# Patient Record
Sex: Female | Born: 2000 | Hispanic: No | Smoking: Never smoker
Health system: Southern US, Community
[De-identification: ages and names within clinical notes are randomized; demographics above are authoritative.]

## PROBLEM LIST (undated history)

## (undated) DIAGNOSIS — A159 Respiratory tuberculosis unspecified: Secondary | ICD-10-CM

## (undated) DIAGNOSIS — D649 Anemia, unspecified: Secondary | ICD-10-CM

## (undated) DIAGNOSIS — Z789 Other specified health status: Secondary | ICD-10-CM

## (undated) DIAGNOSIS — N390 Urinary tract infection, site not specified: Secondary | ICD-10-CM

## (undated) HISTORY — DX: Anemia, unspecified: D64.9

## (undated) HISTORY — PX: NO PAST SURGERIES: SHX2092

## (undated) HISTORY — DX: Urinary tract infection, site not specified: N39.0

## (undated) HISTORY — DX: Respiratory tuberculosis unspecified: A15.9

---
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
## 2018-02-14 ENCOUNTER — Other Ambulatory Visit: Payer: Self-pay | Admitting: Internal Medicine

## 2018-02-14 ENCOUNTER — Ambulatory Visit
Admission: RE | Admit: 2018-02-14 | Discharge: 2018-02-14 | Disposition: A | Discharge status: Home / Self Care | Payer: No Typology Code available for payment source | Source: Ambulatory Visit | Attending: Internal Medicine | Admitting: Internal Medicine

## 2018-02-14 DIAGNOSIS — A150 Tuberculosis of lung: Secondary | ICD-10-CM

## 2018-03-28 LAB — OB RESULTS CONSOLE GBS: STREP GROUP B AG: NEGATIVE

## 2018-03-28 LAB — OB RESULTS CONSOLE GC/CHLAMYDIA
Chlamydia: NEGATIVE
GC PROBE AMP, GENITAL: NEGATIVE

## 2018-04-15 ENCOUNTER — Other Ambulatory Visit: Payer: Self-pay

## 2018-04-15 ENCOUNTER — Inpatient Hospital Stay (HOSPITAL_COMMUNITY)
Admission: AD | Admit: 2018-04-15 | Discharge: 2018-04-18 | DRG: 806 | Disposition: A | Discharge status: Home / Self Care | Payer: Medicaid Other | Source: Ambulatory Visit | Attending: Obstetrics and Gynecology | Admitting: Obstetrics and Gynecology

## 2018-04-15 ENCOUNTER — Encounter (HOSPITAL_COMMUNITY): Payer: Self-pay

## 2018-04-15 ENCOUNTER — Inpatient Hospital Stay (HOSPITAL_COMMUNITY)
Admission: AD | Admit: 2018-04-15 | Discharge: 2018-04-15 | Disposition: A | Discharge status: Home / Self Care | Payer: Medicaid Other | Source: Ambulatory Visit | Attending: Obstetrics and Gynecology | Admitting: Obstetrics and Gynecology

## 2018-04-15 DIAGNOSIS — Z8759 Personal history of other complications of pregnancy, childbirth and the puerperium: Secondary | ICD-10-CM | POA: Diagnosis not present

## 2018-04-15 DIAGNOSIS — O479 False labor, unspecified: Secondary | ICD-10-CM | POA: Diagnosis present

## 2018-04-15 DIAGNOSIS — D649 Anemia, unspecified: Secondary | ICD-10-CM | POA: Diagnosis present

## 2018-04-15 DIAGNOSIS — Z3A38 38 weeks gestation of pregnancy: Secondary | ICD-10-CM

## 2018-04-15 DIAGNOSIS — O9802 Tuberculosis complicating childbirth: Secondary | ICD-10-CM | POA: Diagnosis present

## 2018-04-15 DIAGNOSIS — O9902 Anemia complicating childbirth: Principal | ICD-10-CM | POA: Diagnosis present

## 2018-04-15 HISTORY — DX: Other specified health status: Z78.9

## 2018-04-15 LAB — URINALYSIS, ROUTINE W REFLEX MICROSCOPIC
Bilirubin Urine: NEGATIVE
Glucose, UA: NEGATIVE mg/dL
Hgb urine dipstick: NEGATIVE
Ketones, ur: NEGATIVE mg/dL
Leukocytes, UA: NEGATIVE
Nitrite: NEGATIVE
PH: 8 (ref 5.0–8.0)
Protein, ur: NEGATIVE mg/dL
SPECIFIC GRAVITY, URINE: 1.003 — AB (ref 1.005–1.030)

## 2018-04-15 NOTE — Progress Notes (Addendum)
G1 @ 38.[redacted] wksga. Here dt ctx since 03 q3-5 mins back pain 4-5/10. Denies LOF or bleeding. + FM noted.   VE: 3.5/70/-2   1610: Provider notified. Will come to unit to assess.   1020: MD at bs. Ordered to have pt walk for 2 hrs.   1230: back from walking. MD notified  1240: MD assistant called unit. Ordered to have nurse recheck cervix with call back   1247: MD notified. Report given of cervical exam to his assistant. Will call back.   1321: d/c instructions given with pt understanding. Pt left unit with husband via ambulatory

## 2018-04-16 ENCOUNTER — Inpatient Hospital Stay (HOSPITAL_COMMUNITY): Payer: Medicaid Other | Admitting: Anesthesiology

## 2018-04-16 ENCOUNTER — Other Ambulatory Visit: Payer: Self-pay

## 2018-04-16 ENCOUNTER — Encounter (HOSPITAL_COMMUNITY): Payer: Self-pay | Admitting: *Deleted

## 2018-04-16 DIAGNOSIS — O479 False labor, unspecified: Secondary | ICD-10-CM | POA: Diagnosis present

## 2018-04-16 DIAGNOSIS — O9902 Anemia complicating childbirth: Secondary | ICD-10-CM | POA: Diagnosis present

## 2018-04-16 DIAGNOSIS — D649 Anemia, unspecified: Secondary | ICD-10-CM | POA: Diagnosis present

## 2018-04-16 DIAGNOSIS — Z3483 Encounter for supervision of other normal pregnancy, third trimester: Secondary | ICD-10-CM | POA: Diagnosis present

## 2018-04-16 DIAGNOSIS — O9802 Tuberculosis complicating childbirth: Secondary | ICD-10-CM | POA: Diagnosis present

## 2018-04-16 DIAGNOSIS — Z3A38 38 weeks gestation of pregnancy: Secondary | ICD-10-CM | POA: Diagnosis not present

## 2018-04-16 LAB — CBC
HEMATOCRIT: 35.8 % — AB (ref 36.0–46.0)
HEMOGLOBIN: 11.5 g/dL — AB (ref 12.0–15.0)
MCH: 26.9 pg (ref 26.0–34.0)
MCHC: 32.1 g/dL (ref 30.0–36.0)
MCV: 83.6 fL (ref 78.0–100.0)
Platelets: 280 10*3/uL (ref 150–400)
RBC: 4.28 MIL/uL (ref 3.87–5.11)
RDW: 14.5 % (ref 11.5–15.5)
WBC: 16 10*3/uL — ABNORMAL HIGH (ref 4.0–10.5)

## 2018-04-16 LAB — TYPE AND SCREEN
ABO/RH(D): AB POS
Antibody Screen: NEGATIVE

## 2018-04-16 LAB — RPR: RPR Ser Ql: NONREACTIVE

## 2018-04-16 LAB — ABO/RH: ABO/RH(D): AB POS

## 2018-04-16 MED ORDER — TERBUTALINE SULFATE 1 MG/ML IJ SOLN
0.2500 mg | Freq: Once | INTRAMUSCULAR | Status: DC | PRN
  Filled 2018-04-16: qty 1

## 2018-04-16 MED ORDER — LACTATED RINGERS IV SOLN
500.0000 mL | Freq: Once | INTRAVENOUS | Status: AC
  Administered 2018-04-16: 500 mL via INTRAVENOUS

## 2018-04-16 MED ORDER — OXYTOCIN BOLUS FROM INFUSION
500.0000 mL | Freq: Once | INTRAVENOUS | Status: AC
  Administered 2018-04-16: 500 mL via INTRAVENOUS

## 2018-04-16 MED ORDER — FENTANYL CITRATE (PF) 100 MCG/2ML IJ SOLN
50.0000 ug | INTRAMUSCULAR | Status: DC | PRN
  Administered 2018-04-16 (×2): 100 ug via INTRAVENOUS
  Administered 2018-04-16: 50 ug via INTRAVENOUS
  Administered 2018-04-16: 100 ug via INTRAVENOUS
  Filled 2018-04-16 (×4): qty 2

## 2018-04-16 MED ORDER — LACTATED RINGERS IV SOLN: 500.0000 mL | Freq: Once | INTRAVENOUS | Status: DC

## 2018-04-16 MED ORDER — OXYTOCIN 40 UNITS IN LACTATED RINGERS INFUSION - SIMPLE MED
1.0000 m[IU]/min | INTRAVENOUS | Status: DC
  Administered 2018-04-16: 1 m[IU]/min via INTRAVENOUS
  Filled 2018-04-16: qty 1000

## 2018-04-16 MED ORDER — LACTATED RINGERS IV SOLN
INTRAVENOUS | Status: DC
  Administered 2018-04-16 (×3): via INTRAVENOUS

## 2018-04-16 MED ORDER — FENTANYL 2.5 MCG/ML BUPIVACAINE 1/10 % EPIDURAL INFUSION (WH - ANES)
14.0000 mL/h | INTRAMUSCULAR | Status: DC | PRN
  Administered 2018-04-16: 14 mL/h via EPIDURAL
  Filled 2018-04-16: qty 100

## 2018-04-16 MED ORDER — OXYCODONE-ACETAMINOPHEN 5-325 MG PO TABS: 1.0000 | ORAL_TABLET | ORAL | Status: DC | PRN

## 2018-04-16 MED ORDER — SOD CITRATE-CITRIC ACID 500-334 MG/5ML PO SOLN: 30.0000 mL | ORAL | Status: DC | PRN

## 2018-04-16 MED ORDER — EPHEDRINE 5 MG/ML INJ
10.0000 mg | INTRAVENOUS | Status: DC | PRN
  Filled 2018-04-16: qty 2

## 2018-04-16 MED ORDER — DIPHENHYDRAMINE HCL 50 MG/ML IJ SOLN: 12.5000 mg | INTRAMUSCULAR | Status: DC | PRN

## 2018-04-16 MED ORDER — PHENYLEPHRINE 40 MCG/ML (10ML) SYRINGE FOR IV PUSH (FOR BLOOD PRESSURE SUPPORT)
80.0000 ug | PREFILLED_SYRINGE | INTRAVENOUS | Status: DC | PRN
  Filled 2018-04-16: qty 10
  Filled 2018-04-16: qty 5

## 2018-04-16 MED ORDER — ACETAMINOPHEN 325 MG PO TABS: 650.0000 mg | ORAL_TABLET | ORAL | Status: DC | PRN

## 2018-04-16 MED ORDER — ONDANSETRON HCL 4 MG/2ML IJ SOLN: 4.0000 mg | Freq: Four times a day (QID) | INTRAMUSCULAR | Status: DC | PRN

## 2018-04-16 MED ORDER — OXYTOCIN 40 UNITS IN LACTATED RINGERS INFUSION - SIMPLE MED: 2.5000 [IU]/h | INTRAVENOUS | Status: DC

## 2018-04-16 MED ORDER — LIDOCAINE HCL (PF) 1 % IJ SOLN
INTRAMUSCULAR | Status: DC | PRN
  Administered 2018-04-16 (×2): 5 mL via EPIDURAL

## 2018-04-16 MED ORDER — LIDOCAINE HCL (PF) 1 % IJ SOLN
30.0000 mL | INTRAMUSCULAR | Status: DC | PRN
  Administered 2018-04-16: 30 mL via SUBCUTANEOUS
  Filled 2018-04-16: qty 30

## 2018-04-16 MED ORDER — OXYTOCIN 40 UNITS IN LACTATED RINGERS INFUSION - SIMPLE MED
1.0000 m[IU]/min | INTRAVENOUS | Status: DC
  Administered 2018-04-16: 3 m[IU]/min via INTRAVENOUS

## 2018-04-16 MED ORDER — PHENYLEPHRINE 40 MCG/ML (10ML) SYRINGE FOR IV PUSH (FOR BLOOD PRESSURE SUPPORT)
80.0000 ug | PREFILLED_SYRINGE | INTRAVENOUS | Status: DC | PRN
  Filled 2018-04-16: qty 5

## 2018-04-16 MED ORDER — OXYCODONE-ACETAMINOPHEN 5-325 MG PO TABS
2.0000 | ORAL_TABLET | ORAL | Status: DC | PRN
Start: 2018-04-16 — End: 2018-04-17

## 2018-04-16 MED ORDER — FLEET ENEMA 7-19 GM/118ML RE ENEM: 1.0000 | ENEMA | Freq: Every day | RECTAL | Status: DC | PRN

## 2018-04-16 MED ORDER — LACTATED RINGERS IV SOLN
500.0000 mL | INTRAVENOUS | Status: DC | PRN
  Administered 2018-04-17: 500 mL via INTRAVENOUS

## 2018-04-16 NOTE — Progress Notes (Signed)
Pain medication options offerred to pt

## 2018-04-16 NOTE — Progress Notes (Signed)
Discussed with pt that isolation precautions are being initiated due to hx of incomplete treatment of TB

## 2018-04-16 NOTE — Progress Notes (Signed)
Pt in latent labor per Ivonne Andrew RN

## 2018-04-16 NOTE — Progress Notes (Signed)
Pt off monitor up to walk

## 2018-04-16 NOTE — H&P (Addendum)
LABOR AND DELIVERY ADMISSION HISTORY AND PHYSICAL NOTE  Carrie York is a 17 y.o. female G1P0 with IUP at [redacted]w[redacted]d by LMP presenting for contractions.   She reports positive fetal movement. She denies leakage of fluid or vaginal bleeding.  Prenatal History/Complications: latent tuberculosis inadequately treated.  Consanguinity, father of baby is distant relative  Past Medical History: Past Medical History:  Diagnosis Date  . Medical history non-contributory     Past Surgical History: Past Surgical History:  Procedure Laterality Date  . NO PAST SURGERIES      Obstetrical History: OB History    Gravida  1   Para      Term      Preterm      AB      Living        SAB      TAB      Ectopic      Multiple      Live Births              Social History: Social History   Socioeconomic History  . Marital status: Married    Spouse name: Not on file  . Number of children: Not on file  . Years of education: Not on file  . Highest education level: Not on file  Occupational History  . Not on file  Social Needs  . Financial resource strain: Not on file  . Food insecurity:    Worry: Not on file    Inability: Not on file  . Transportation needs:    Medical: Not on file    Non-medical: Not on file  Tobacco Use  . Smoking status: Never Smoker  . Smokeless tobacco: Never Used  Substance and Sexual Activity  . Alcohol use: No  . Drug use: No  . Sexual activity: Never    Birth control/protection: None  Lifestyle  . Physical activity:    Days per week: Not on file    Minutes per session: Not on file  . Stress: Not on file  Relationships  . Social connections:    Talks on phone: Not on file    Gets together: Not on file    Attends religious service: Not on file    Active member of club or organization: Not on file    Attends meetings of clubs or organizations: Not on file    Relationship status: Not on file  Other Topics Concern  . Not on file  Social  History Narrative  . Not on file    Family History: No family history on file.  Allergies: No Known Allergies  Medications Prior to Admission  Medication Sig Dispense Refill Last Dose  . cephALEXin (KEFLEX) 500 MG capsule Take 1 capsule (500 mg total) by mouth 3 (three) times daily. 30 capsule 0      Review of Systems   All systems reviewed and negative except as stated in HPI  Blood pressure 122/74, pulse 92, temperature 97.7 F (36.5 C), resp. rate 18, height  (1.549 m), weight 68.9 kg (152 lb). General appearance: alert, cooperative and no distress Lungs: no respiratory distress Heart: regular rate Abdomen: soft, non-tender Extremities: No calf swelling or tenderness Presentation: cephalic by bedside ultrasound Fetal monitoring: 135bpm, moderate variability, + accelerations, no decelerations Uterine activity: q2-58min Dilation: 5.5 Effacement (%): 90 Station: -1 Exam by:: K.wilson,RN   Prenatal labs: ABO, Rh: AB/Positive/-- (12/13 0000) Antibody: Negative (12/13 0000) Rubella: immune RPR: Nonreactive (12/13 0000)  HBsAg: Negative (12/13 0000)  HIV: Non-reactive (12/13 0000)  GBS: Negative (04/29 0000)  Genetic screening:  negative Anatomy US: normal  Prenatal Transfer Tool  Maternal Diabetes: No Genetic Screening: Normal Maternal Ultrasounds/Referrals: Normal Fetal Ultrasounds or other Referrals:  None Maternal Substance Abuse:  No Significant Maternal Medications:  None Significant Maternal Lab Results: Lab values include: Group B Strep negative  Results for orders placed or performed during the hospital encounter of 04/15/18 (from the past 24 hour(s))  Urinalysis, Routine w reflex microscopic   Collection Time: 04/15/18  9:07 AM  Result Value Ref Range   Color, Urine YELLOW YELLOW   APPearance CLEAR CLEAR   Specific Gravity, Urine 1.003 (L) 1.005 - 1.030   pH 8.0 5.0 - 8.0   Glucose, UA NEGATIVE NEGATIVE mg/dL   Hgb urine dipstick NEGATIVE  NEGATIVE   Bilirubin Urine NEGATIVE NEGATIVE   Ketones, ur NEGATIVE NEGATIVE mg/dL   Protein, ur NEGATIVE NEGATIVE mg/dL   Nitrite NEGATIVE NEGATIVE   Leukocytes, UA NEGATIVE NEGATIVE    There are no active problems to display for this patient.   Assessment: Carrie York is a 17 y.o. G1P0 at [redacted]w[redacted]d here for SOL  #Labor:expectant management #Pain: Nitrous oxide, IV fentanyl. Patient declined epidural #FWB: Category I #ID:  GBS negative #MOF: breast #MOC:family planning vs condoms. #Circ:  Female, no circumcision   Leland Her, DO PGY-2 5/18/20193:09 AM  CNM attestation:  I have seen and examined this patient; I agree with above documentation in the resident's note. Her preg [facility].  Carrie York is a 17 y.o. G1P0 here for latent phase labor  PE: BP (!) 128/57   Pulse 96   Temp 98.4 F (36.9 C) (Oral)   Resp 16   Ht  (1.549 m)   Wt 68.9 kg (152 lb)   BMI 28.72 kg/m   Resp: normal effort, no distress Abd: gravid  ROS, labs, PMH reviewed  Plan: Admit to [facility] Expectant management Anticipate SVD  Cam Hai CNM 04/16/2018, 6:56 AM

## 2018-04-16 NOTE — Progress Notes (Addendum)
Carrie York is a 17 y.o.  G1P0 at [redacted]w[redacted]d by LMP admitted for contractions  Subjective: Patient tolerating pain well, no questions or complaints at this time.   Objective: BP 107/63   Pulse 90   Temp 98.4 F (36.9 C) (Oral)   Resp 16   Ht  (1.549 m)   Wt 68.9 kg (152 lb)   BMI 28.72 kg/m  No intake/output data recorded. No intake/output data recorded.  FHT:  FHR: 125 bpm, variability: moderate,  accelerations:  Present,  decelerations:  Absent UC:   irregular, every 6-10 minute SVE:   Dilation: 5 Effacement (%): 90 Station: -1 Exam by:: Dr Cristie Hem   Labs: Lab Results  Component Value Date   WBC 16.0 (H) 04/16/2018   HGB 11.5 (L) 04/16/2018   HCT 35.8 (L) 04/16/2018   MCV 83.6 04/16/2018   PLT 280 04/16/2018    Assessment / Plan: 17 y.o.G1P0 at [redacted]w[redacted]d by admitted for contractions  Labor: Prolonged latent phase. Unchanged s/p walking. AROM performed @ 1250 PM. Will consider augmentation with Pitocin if unchanged on next check in 2h Fetal Wellbeing:  Category I Pain Control:  Labor support without medications I/D:  GBS negative. Latent TB, ID specialist recommended outpatient f/u and no need for airborne precautions Anticipated MOD:  NSVD  Felisa Bonier, MD, PGY-1 Family Medicine -[facility] [place] 04/16/2018, 1:03 PM

## 2018-04-16 NOTE — Anesthesia Pain Management Evaluation Note (Signed)
  [facility] Pain Management Visit Note  Patient: Carrie York, 17 y.o., female  "Hello I am a member of the anesthesia team at [facility]. We have an anesthesia team available at all times to provide care throughout the hospital, including epidural management and anesthesia for C-section. I don't know your plan for the delivery whether it a natural birth, water birth, IV sedation, nitrous supplementation, doula or epidural, but we want to meet your pain goals."   1.Was your pain managed to your expectations on prior hospitalizations?   No prior hospitalizations  2.What is your expectation for pain management during this hospitalization?     IV pain meds  3.How can we help you reach that goal? unsure  Record the patient's initial score and the patient's pain goal.   Pain: 5  Pain Goal: 8 [facility] wants you to be able to say your pain was always managed very well. Report via RN as pt recently transferred to isolation secondary to positive ppd test.  Cephus Shelling 04/16/2018

## 2018-04-16 NOTE — Anesthesia Preprocedure Evaluation (Signed)
Anesthesia Evaluation  Patient identified by MRN, date of birth, ID band Patient awake    Reviewed: Allergy & Precautions, H&P , NPO status , Patient's Chart, lab work & pertinent test results  Airway Mallampati: II   Neck ROM: full    Dental   Pulmonary neg pulmonary ROS,    breath sounds clear to auscultation       Cardiovascular negative cardio ROS   Rhythm:regular Rate:Normal     Neuro/Psych    GI/Hepatic   Endo/Other    Renal/GU      Musculoskeletal   Abdominal   Peds  Hematology  (+) anemia ,   Anesthesia Other Findings   Reproductive/Obstetrics (+) Pregnancy                             Anesthesia Physical Anesthesia Plan  ASA: II  Anesthesia Plan: Epidural   Post-op Pain Management:    Induction: Intravenous  PONV Risk Score and Plan: 2 and Treatment may vary due to age or medical condition  Airway Management Planned: Natural Airway  Additional Equipment:   Intra-op Plan:   Post-operative Plan:   Informed Consent: I have reviewed the patients History and Physical, chart, labs and discussed the procedure including the risks, benefits and alternatives for the proposed anesthesia with the patient or authorized representative who has indicated his/her understanding and acceptance.     Plan Discussed with: Anesthesiologist  Anesthesia Plan Comments:         Anesthesia Quick Evaluation  

## 2018-04-16 NOTE — Progress Notes (Addendum)
Shequilla Gayton is a 17 y.o. G1P0 at [redacted]w[redacted]d by by LMPadmitted for contractions  Subjective: Patient tolerating pain well, no questions or complaints at this time.  Objective: BP 121/78   Pulse 83   Temp 98.2 F (36.8 C) (Oral)   Resp 16   Ht  (1.549 m)   Wt 68.9 kg (152 lb)   BMI 28.72 kg/m  No intake/output data recorded. No intake/output data recorded.  FHT:  FHR: 130 bpm, variability: moderate,  accelerations:  Present,  decelerations:  Absent UC:   irregular, every 3-6 minutes SVE:   Dilation: 6 Effacement (%): 100 Station: 0 Exam by:: Dr Cristie Hem  Labs: Lab Results  Component Value Date   WBC 16.0 (H) 04/16/2018   HGB 11.5 (L) 04/16/2018   HCT 35.8 (L) 04/16/2018   MCV 83.6 04/16/2018   PLT 280 04/16/2018    Assessment / Plan: 17 y.o.G1P0 at [redacted]w[redacted]d in active labor  Labor: Progressing normally, will start Pitocin for augmentation AROM  Fetal Wellbeing:  Category I Pain Control:  IV pain meds I/D:GBS negative. Latent TB, ID specialist recommended outpatient f/u and no need for airborne precautions Anticipated MOD:  NSVD  Felisa Bonier, MD, PGY-1 Family Medicine -[facility] [place] 04/16/2018, 3:32 PM  I was consulted RE: the exam and agree with above. RN checked behind Resident.   Katrinka Blazing, [place] 04/16/2018 3:40 PM

## 2018-04-16 NOTE — Progress Notes (Signed)
Carrie York is a 17 y.o. G1P0 at [redacted]w[redacted]d by by LMPadmitted for contractions  Subjective: Patient tolerating pain well, no questions or complaints at this time.  Objective: BP 115/75   Pulse 91   Temp 98.5 F (36.9 C) (Oral)   Resp 18   Ht  (1.549 m)   Wt 68.9 kg (152 lb)   BMI 28.72 kg/m  No intake/output data recorded. No intake/output data recorded.  FHT:  FHR: 135 bpm, variability: moderate,  accelerations:  Present,  decelerations:  Absent UC:   irregular, every 5-6 minutes SVE:   Dilation: 6 Effacement (%): 100 Station: 0 Exam by:: Dr Cristie Hem   Labs: Lab Results  Component Value Date   WBC 16.0 (H) 04/16/2018   HGB 11.5 (L) 04/16/2018   HCT 35.8 (L) 04/16/2018   MCV 83.6 04/16/2018   PLT 280 04/16/2018    Assessment / Plan: 17 y.o.G1P0 at [redacted]w[redacted]d in active labor  Labor: AROM , unchanged from previous check, will start Pitocin now for augmentation  Fetal Wellbeing:  Category I Pain Control:  IV pain meds I/D:GBS negative. Latent TB, ID specialistrecommended outpatient f/u and no need for airborne precautions Anticipated MOD:  NSVD  Felisa Bonier, MD, PGY-1 Family Medicine -[facility] [place] 04/16/2018, 5:21 PM

## 2018-04-16 NOTE — Progress Notes (Signed)
Patient ID: Carrie York, female   DOB: September 29, 2001, 17 y.o.   MRN: [id number] [place] Rayo is a 17 y.o. G1P0 at [redacted]w[redacted]d.  Subjective: Coping adequately w/ Fentanyl  Objective: BP 122/66   Pulse 97   Temp 98.7 F (37.1 C) (Oral)   Resp 14   Ht  (1.549 m)   Wt 152 lb (68.9 kg)   BMI 28.72 kg/m    FHT:  FHR: 140 bpm, variability: mod,  accelerations:  15x15,  decelerations:  none UC:   Q 2-5 minutes, strong Dilation: 7 Effacement (%): 100 Cervical Position: Middle Station: 0 Presentation: Vertex, asynclitic? Exam by:: Renee Rival CNM  Labs: NA  Assessment / Plan: [redacted]w[redacted]d week IUP Labor: Active, Now in good contraction pattern on pitocin.  Fetal Wellbeing:  Category I Pain Control:  Fentanyl Anticipated MOD:  SVD. Dr. Emelda Fear updated.   Katrinka Blazing, [place], CNM 04/16/2018 8:21 PM

## 2018-04-16 NOTE — Progress Notes (Signed)
Carrie York is a 17 y.o. G1P0 at [redacted]w[redacted]d by  admitted for rupture of membranes  Subjective:questions are being raised regarding pt's hx of "Latent TB", from 2013. [place] and reports being given three bottles of medicine, and that she took two of the bottles, and did not complete the third bottle. CXR was done 01/2018 and was reported as negative for disease, as was CXR in 05/2012, and 03/2013. Notes from Prenatal record:      Objective: BP (!) 112/58   Pulse 88   Temp 98.2 F (36.8 C) (Oral)   Resp 18   Ht  (1.549 m)   Wt 152 lb (68.9 kg)   BMI 28.72 kg/m  No intake/output data recorded. No intake/output data recorded.  FHT:  FHR: 145 bpm, variability: moderate,  accelerations:  Present,  decelerations:  Absent UC:   regular, every 5-6 minutes SVE:   Dilation: 5 Effacement (%): 90 Station: -1 Exam by:: s grindstaff rn   Labs: Lab Results  Component Value Date   WBC 16.0 (H) 04/16/2018   HGB 11.5 (L) 04/16/2018   HCT 35.8 (L) 04/16/2018   MCV 83.6 04/16/2018   PLT 280 04/16/2018    Assessment / Plan: Protracted latent phase No need for Respiratiory precautions according to Dr Ninetta Lights of INF DISEASE. Will need consult to INF Disease or back at Health DEPT, within 3 months of delivery, to be offered Latent TB treatment due to incomplete tx in 2013. Labor: prolonged latent phase Preeclampsia:    Fetal Wellbeing:  Category I Pain Control:  Labor support without medications I/D:  n/a Anticipated MOD:  NSVD Will discus AROM or Pitocin to move pt labor forward. Tilda Burrow 04/16/2018, 10:20 AM

## 2018-04-16 NOTE — Progress Notes (Addendum)
Carrie York is a 17 y.o. G1P0 at [redacted]w[redacted]d by LMP admitted for contractions  Subjective: Patient tolerating pain well, no questions or complaints at this time  Objective: BP 114/72   Pulse 91   Temp 98.2 F (36.8 C) (Oral)   Resp 20   Ht  (1.549 m)   Wt 68.9 kg (152 lb)   BMI 28.72 kg/m  No intake/output data recorded. No intake/output data recorded.  FHT:  FHR: 135 bpm, variability: moderate,  accelerations:  Present,  decelerations:  Absent UC:   irregular, every 6-10 minutes SVE:   Dilation: 5 Effacement (%): 90 Station: -1 Exam by:: s grindstaff rn   Labs: Lab Results  Component Value Date   WBC 16.0 (H) 04/16/2018   HGB 11.5 (L) 04/16/2018   HCT 35.8 (L) 04/16/2018   MCV 83.6 04/16/2018   PLT 280 04/16/2018    Assessment / Plan:  17 y.o. G1P0 at [redacted]w[redacted]d by  admitted for contractions  Labor: Prolonged latent phase. Counceled on augmentation options, patient prefers to try walking at this time. Will do intermittent monitoring. Will consider augmentation with Pit or AROM if remains unchanged on next check Fetal Wellbeing:  Category I Pain Control:  Labor support without medications I/D:  GBS negative. Latent TB, ID specialist consulted and no precautions indicated. Needs f/u with INF Disease or back at [facility] DEPT, within 3 months of delivery, to be offered Latent TB treatment due to incomplete tx in 2013. Anticipated MOD:  NSVD  Felisa Bonier, MD, PGY-1 Family Medicine -[facility] [place] 04/16/2018, 10:59 AM

## 2018-04-16 NOTE — Progress Notes (Signed)
Patient ID: Carrie York, female   DOB: August 16, 2001, 17 y.o.   MRN: [id number]  Doing well with ctx  BP 128/57, other VSS FHR 130s, +accels, no decels Ctx q 5-6 mins, spont Cx was 5/80/-2 per RN exam at 0530  IUP@term  Early active labor GBS neg  Expectant management Anticipate SVD  Cam Hai 04/16/2018

## 2018-04-16 NOTE — MAU Note (Signed)
Was seen MAU yesterday and 4cm. Ctxs stronger since 1900. Denies LOF or bleeding

## 2018-04-16 NOTE — Anesthesia Procedure Notes (Signed)
Epidural Patient location during procedure: OB Start time: 04/16/2018 8:39 PM End time: 04/16/2018 8:49 PM  Staffing Anesthesiologist: Achille Rich, MD Performed: anesthesiologist   Preanesthetic Checklist Completed: patient identified, site marked, pre-op evaluation, timeout performed, IV checked, risks and benefits discussed and monitors and equipment checked  Epidural Patient position: sitting Prep: DuraPrep Patient monitoring: heart rate, cardiac monitor, continuous pulse ox and blood pressure Approach: midline Location: L2-L3 Injection technique: LOR saline  Needle:  Needle type: Tuohy  Needle gauge: 17 G Needle length: 9 cm Needle insertion depth: 5 cm Catheter type: closed end flexible Catheter size: 19 Gauge Catheter at skin depth: 11 cm Test dose: negative and Other  Assessment Events: blood not aspirated, injection not painful, no injection resistance and negative IV test  Additional Notes Informed consent obtained prior to proceeding including risk of failure, 1% risk of PDPH, risk of minor discomfort and bruising.  Discussed rare but serious complications including epidural abscess, permanent nerve injury, epidural hematoma.  Discussed alternatives to epidural analgesia and patient desires to proceed.  Timeout performed pre-procedure verifying patient name, procedure, and platelet count.  Patient tolerated procedure well. Reason for block:procedure for pain

## 2018-04-17 ENCOUNTER — Encounter (HOSPITAL_COMMUNITY): Payer: Self-pay | Admitting: General Practice

## 2018-04-17 DIAGNOSIS — Z3A38 38 weeks gestation of pregnancy: Secondary | ICD-10-CM

## 2018-04-17 LAB — CBC
HCT: 29.9 % — ABNORMAL LOW (ref 36.0–46.0)
Hemoglobin: 9.7 g/dL — ABNORMAL LOW (ref 12.0–15.0)
MCH: 27.2 pg (ref 26.0–34.0)
MCHC: 32.4 g/dL (ref 30.0–36.0)
MCV: 83.8 fL (ref 78.0–100.0)
PLATELETS: 257 10*3/uL (ref 150–400)
RBC: 3.57 MIL/uL — AB (ref 3.87–5.11)
RDW: 14.4 % (ref 11.5–15.5)
WBC: 17.8 10*3/uL — ABNORMAL HIGH (ref 4.0–10.5)

## 2018-04-17 MED ORDER — MISOPROSTOL 200 MCG PO TABS
800.0000 ug | ORAL_TABLET | Freq: Once | ORAL | Status: AC
  Administered 2018-04-17: 800 ug via RECTAL

## 2018-04-17 MED ORDER — COCONUT OIL OIL: 1.0000 "application " | TOPICAL_OIL | Status: DC | PRN

## 2018-04-17 MED ORDER — DIPHENHYDRAMINE HCL 25 MG PO CAPS: 25.0000 mg | ORAL_CAPSULE | Freq: Four times a day (QID) | ORAL | Status: DC | PRN

## 2018-04-17 MED ORDER — ACETAMINOPHEN 325 MG PO TABS: 650.0000 mg | ORAL_TABLET | ORAL | Status: DC | PRN

## 2018-04-17 MED ORDER — PRENATAL MULTIVITAMIN CH
1.0000 | ORAL_TABLET | Freq: Every day | ORAL | Status: DC
  Administered 2018-04-17 – 2018-04-18 (×2): 1 via ORAL
  Filled 2018-04-17 (×2): qty 1

## 2018-04-17 MED ORDER — SIMETHICONE 80 MG PO CHEW: 80.0000 mg | CHEWABLE_TABLET | ORAL | Status: DC | PRN

## 2018-04-17 MED ORDER — DIBUCAINE 1 % RE OINT: 1.0000 "application " | TOPICAL_OINTMENT | RECTAL | Status: DC | PRN

## 2018-04-17 MED ORDER — MISOPROSTOL 200 MCG PO TABS
ORAL_TABLET | ORAL | Status: AC
  Filled 2018-04-17: qty 5

## 2018-04-17 MED ORDER — SENNOSIDES-DOCUSATE SODIUM 8.6-50 MG PO TABS
2.0000 | ORAL_TABLET | ORAL | Status: DC
  Administered 2018-04-18: 2 via ORAL
  Filled 2018-04-17: qty 2

## 2018-04-17 MED ORDER — ONDANSETRON HCL 4 MG PO TABS: 4.0000 mg | ORAL_TABLET | ORAL | Status: DC | PRN

## 2018-04-17 MED ORDER — ZOLPIDEM TARTRATE 5 MG PO TABS: 5.0000 mg | ORAL_TABLET | Freq: Every evening | ORAL | Status: DC | PRN

## 2018-04-17 MED ORDER — WITCH HAZEL-GLYCERIN EX PADS: 1.0000 "application " | MEDICATED_PAD | CUTANEOUS | Status: DC | PRN

## 2018-04-17 MED ORDER — BENZOCAINE-MENTHOL 20-0.5 % EX AERO: 1.0000 "application " | INHALATION_SPRAY | CUTANEOUS | Status: DC | PRN

## 2018-04-17 MED ORDER — ONDANSETRON HCL 4 MG/2ML IJ SOLN: 4.0000 mg | INTRAMUSCULAR | Status: DC | PRN

## 2018-04-17 MED ORDER — IBUPROFEN 600 MG PO TABS
600.0000 mg | ORAL_TABLET | Freq: Four times a day (QID) | ORAL | Status: DC
  Administered 2018-04-17 – 2018-04-18 (×6): 600 mg via ORAL
  Filled 2018-04-17 (×6): qty 1

## 2018-04-17 MED ORDER — TETANUS-DIPHTH-ACELL PERTUSSIS 5-2.5-18.5 LF-MCG/0.5 IM SUSP: 0.5000 mL | Freq: Once | INTRAMUSCULAR | Status: DC

## 2018-04-17 NOTE — Progress Notes (Addendum)
Called by RN that patient passing blood clots with fundal massage. On exam fundus firm but with lower uterine segment boggy with blood clots. Manually swept and blood clots removed by myself and Dr. Nira Retort. Rectal cytotec given. Uterus firm throughout. Foley placed and should be kept in until tomorrow morning. Blood clots weighed at 372cc in addition to the 150cc EBL from delivery, totaling ~500cc. Patient doing well with stable vital signs, monitor for 30-58min before transfer to postpartum.  Leland Her, DO PGY-2, [facility] 04/17/2018 1:09 AM

## 2018-04-17 NOTE — Anesthesia Postprocedure Evaluation (Signed)
Anesthesia Post Note  Patient: Carrie York  Procedure(s) Performed: AN AD HOC LABOR EPIDURAL     Patient location during evaluation: Mother Baby Anesthesia Type: Epidural Level of consciousness: awake and alert and oriented Pain management: satisfactory to patient Vital Signs Assessment: post-procedure vital signs reviewed and stable Respiratory status: respiratory function stable Cardiovascular status: stable Postop Assessment: no headache, no backache, epidural receding, patient able to bend at knees, no signs of nausea or vomiting and adequate PO intake Anesthetic complications: no    Last Vitals:  Vitals:   04/17/18 0346 04/17/18 0500  BP: (!) 100/58 103/69  Pulse: (!) 102 98  Resp: 16 16  Temp: 37.4 C 37.6 C  SpO2: 97% 96%    Last Pain:  Vitals:   04/17/18 0505  TempSrc:   PainSc: 0-No pain   Pain Goal: Patients Stated Pain Goal: 0 (04/16/18 0004)               Erisha Paugh

## 2018-04-17 NOTE — Lactation Note (Signed)
This note was copied from a baby's chart. Lactation Consultation Note  Patient Name: Carrie York Today's Date: 04/17/2018 Reason for consult: Initial assessment;Primapara;1st time breastfeeding;Early term 62-38.6wks  24 hours old early term female who is being partially BF and formula fed by his mother, that was her feeding choice upon admission, she's a P1. Baby was awake on bassinet when entering the room (dad was playing with him) offered assistance with latch but mom politely declined stating baby already fed, he had some formula on his last feeding, bottle of formula still sitting by the sink. Asked mom to call for latch assistance the next time baby is ready to feed.  Per mom feedings at the breast have been comfortable so far, but she hasn't been able to hear any swallows or hasn't watched for them. Taught her how to hand express and got one droplet of colostrum out of her left breast. Mom has large bulbous everted nipples and both looked intact upon examination. She doesn't have a pump at home, offered a hand pump from hospital and she graciously agreed. Pump instructions, cleaning and storage were reviewed.  Encouraged mom to feed baby STS 8-12 times/24 hours or sooner if feeding cues are present. Mom will try to hand express some colostrum and feed it to baby with finger feeding before supplementing with formula. Formula volumes were also discussed. Reviewed BF brochure, BF resources and feeding diary, mom aware of LC services and will call PRN.    Maternal Data Formula Feeding for Exclusion: Yes Reason for exclusion: Mother's choice to formula and breast feed on admission Has patient been taught Hand Expression?: Yes Does the patient have breastfeeding experience prior to this delivery?: No  Feeding Feeding Type: Formula Nipple Type: Slow - flow Length of feed: 15 min  LATCH Score Latch: Grasps breast easily, tongue down, lips flanged, rhythmical sucking.  Audible Swallowing:  A few with stimulation  Type of Nipple: Everted at rest and after stimulation  Comfort (Breast/Nipple): Soft / non-tender  Hold (Positioning): Full assist, staff holds infant at breast  LATCH Score: 7  Interventions Interventions: Breast feeding basics reviewed;Hand express;Hand pump;Breast compression;Breast massage  Lactation Tools Discussed/Used Tools: Pump Breast pump type: Manual WIC Program: Yes Pump Review: Setup, frequency, and cleaning;Milk Storage Initiated by:: MPeck Date initiated:: 04/17/18   Consult Status Consult Status: Follow-up Date: 04/18/18 Follow-up type: In-patient    Makyia Erxleben Venetia Constable 04/17/2018, 9:10 PM

## 2018-04-17 NOTE — Progress Notes (Addendum)
Post Partum Day 1 Subjective: no complaints, voiding and tolerating PO  Objective: Blood pressure 103/69, pulse 98, temperature 99.7 F (37.6 C), temperature source Oral, resp. rate 16, height  (1.549 m), weight 152 lb (68.9 kg), SpO2 96 %, unknown if currently breastfeeding.  Physical Exam:  General: alert and no distress Lochia: appropriate Uterine Fundus: firm DVT Evaluation: No evidence of DVT seen on physical exam.  Recent Labs    04/16/18 0324 04/17/18 0517  HGB 11.5* 9.7*  HCT 35.8* 29.9*    Assessment/Plan: Plan for discharge tomorrow  I examined this pt and agree with above assessment    LOS: 1 day   Garnette Gunner 04/17/2018, 7:41 AM

## 2018-04-17 NOTE — Progress Notes (Signed)
Assisted up to bathroom, SBA only, but unable to void. Pericare demonstrated and returned. No dizziness noted.

## 2018-04-18 ENCOUNTER — Encounter (HOSPITAL_COMMUNITY): Payer: Self-pay | Admitting: General Practice

## 2018-04-18 DIAGNOSIS — Z8759 Personal history of other complications of pregnancy, childbirth and the puerperium: Secondary | ICD-10-CM | POA: Diagnosis not present

## 2018-04-18 MED ORDER — IBUPROFEN 600 MG PO TABS: 600.0000 mg | ORAL_TABLET | Freq: Four times a day (QID) | ORAL | 0 refills | Status: DC

## 2018-04-18 NOTE — Progress Notes (Signed)
CSW received consult due to score 12 on Edinburgh Depression Screen.    When CSW arrived, [name] was resting on the couch and [name] was observing infant sleeping in the bassinet. CSW explained CSW's role and [name] gave CSW permission to meet with [name] while [name] was present. [name] was polite and receptive to meeting with CSW.  CSW reviewed [name]'s edinburgh results and thanked [name] for being forthcoming and honest. [name] and [name] communicated they have a good support team that mainly consist of [name]'s family member. They also reported having all the essential items needed for parenting.   CSW provided education regarding Baby Blues vs PMADs and provided [name] with information about support groups held at [facility].  CSW encouraged [name] to evaluate her mental health throughout the postpartum period with the use of the New Mom Checklist developed by Postpartum Progress and notify a medical professional if symptoms arise. [name] did not present with any acute symptoms and denied SI and HI.   CSW provided [name] with information to apply for Food Stamps and Medicaid.   There are no barriers to discharge.   Carrie York, MSW, LCSW Clinical Social Work [phone] 

## 2018-04-18 NOTE — Discharge Summary (Addendum)
OB Discharge Summary     Patient Name: Carrie York DOB: 2001-10-17 MRN: [id number]  Date of admission: 04/15/2018 Delivering MD: Leland Her   Date of discharge: 04/18/2018  Admitting diagnosis: 38 wks ctx and pain Intrauterine pregnancy: [redacted]w[redacted]d     Secondary diagnosis:  Active Problems:   Uterine contractions during pregnancy   Postpartum hemorrhage  Additional problems: latent TB-incomplete treatment, consanguinity [name] is distant relative     Discharge diagnosis: Term Pregnancy Delivered, Anemia and PPH                                                                                                Post partum procedures:none  Augmentation: AROM and Pitocin  Complications: None  Hospital course:  Onset of Labor With Vaginal Delivery     17 y.o. yo G1P1001 at [redacted]w[redacted]d was admitted in Active Labor on 04/15/2018. Patient had an uncomplicated labor course as follows:  Membrane Rupture Time/Date: 12:56 PM ,04/16/2018   Intrapartum Procedures: Episiotomy: None [1]                                         Lacerations:  2nd degree [3]  Patient had a delivery of a Viable infant. 04/16/2018  Information for the patient's newborn:  Maame, Dack Boy Shalisa [[id number]]  Delivery Method: Vag-Spont    Pateint had an uncomplicated postpartum course.  She is ambulating, tolerating a regular diet, passing flatus, and urinating well. Patient is discharged home in stable condition on 04/18/18.   Physical exam  Vitals:   04/17/18 0800 04/17/18 1430 04/17/18 1649 04/18/18 0556  BP: 107/69 114/70 106/67 105/64  Pulse: 92 83 89 80  Resp: Temp: 98.9 F (37.2 C) 98.5 F (36.9 C) 98.4 F (36.9 C) 97.7 F (36.5 C)  TempSrc: Oral Oral Oral Oral  SpO2: 97%     Weight:      Height:       General: alert, cooperative and no distress Lochia: appropriate Uterine Fundus: firm Incision: Healing well with no significant drainage, No significant erythema DVT Evaluation: No evidence of DVT seen on  physical exam. Negative Homan's sign. No cords or calf tenderness. No significant calf/ankle edema. Labs: Lab Results  Component Value Date   WBC 17.8 (H) 04/17/2018   HGB 9.7 (L) 04/17/2018   HCT 29.9 (L) 04/17/2018   MCV 83.8 04/17/2018   PLT 257 04/17/2018   CMP Latest Ref Rng & Units 04/04/2013  Glucose 70 - 99 mg/dL 782(N)  BUN 6 - 23 mg/dL 17  Creatinine 5.62 - 1.30 mg/dL 8.65  Sodium 784 - 696 mEq/L 135  Potassium 3.5 - 5.1 mEq/L 3.9  Chloride 96 - 112 mEq/L 102  CO2 19 - 32 mEq/L 21  Calcium 8.4 - 10.5 mg/dL 9.0  Total Protein 6.0 - 8.3 g/dL 7.7  Total Bilirubin 0.3 - 1.2 mg/dL 2.9(B)  Alkaline Phos 50 - 162 U/L 81  AST 0 - 37 U/L 71(H)  ALT 0 - 35  U/L 50(H)    Discharge instruction: per After Visit Summary and "Baby and Me Booklet".  After visit meds:  Allergies as of 04/18/2018   No Known Allergies     Medication List    TAKE these medications   ibuprofen 600 MG tablet Commonly known as:  ADVIL,MOTRIN Take 1 tablet (600 mg total) by mouth every 6 (six) hours.   prenatal multivitamin Tabs tablet Take 1 tablet by mouth daily at 12 noon.       Diet: routine diet  Activity: Advance as tolerated. Pelvic rest for 6 weeks.   Outpatient follow up:6 weeks, [facility]-complete latent TB treatment, and contraception Follow up Appt:No future appointments. Follow up Visit:No follow-ups on file.  Postpartum contraception: Natural Family Planning  Newborn Data: Live born female  Birth Weight: 7 lb 8.1 oz (3405 g) APGAR: 7, 9  Newborn Delivery   Birth date/time:  04/16/2018 23:30:00 Delivery type:  Vaginal, Spontaneous     Baby Feeding: Bottle and Breast Disposition:home with mother   04/18/2018 Donette Larry, CNM

## 2018-04-19 ENCOUNTER — Ambulatory Visit: Payer: Self-pay

## 2018-04-19 NOTE — Lactation Note (Signed)
This note was copied from a baby's chart. Lactation Consultation Note  Reviewed basic breastfeeding teaching. Assist mother with proper positioning and latch on technique. Assist mother with breast compression. Infant sustained latch for 20 mins while I observed. Parents taught to observed for good deep latch and swallows. Lots of teaching with parents on cluster feeding and cue base feeding. Advised mother to feed infant at least 8-12 times in 24 hours and with feeding cues.  Discussed treatment and prevention of engorgement. Mother receptive to all teaching. She is active with WIC. She reports that she was given a harmony hand pump by staff member. Mother is aware of all LC services and community support.  Patient Name: Carrie York [id number] Date: 04/19/2018 Reason for consult: Follow-up assessment   Maternal Data    Feeding Feeding Type: Breast Fed Length of feed: 20 min(infant remains at the breast )  LATCH Score Latch: Grasps breast easily, tongue down, lips flanged, rhythmical sucking.  Audible Swallowing: Spontaneous and intermittent  Type of Nipple: Everted at rest and after stimulation  Comfort (Breast/Nipple): Filling, red/small blisters or bruises, mild/mod discomfort  Hold (Positioning): Assistance needed to correctly position infant at breast and maintain latch.  LATCH Score: 8  Interventions Interventions: Breast compression;Adjust position;Support pillows;Position options;Hand pump  Lactation Tools Discussed/Used     Consult Status Consult Status: Complete    Michel Bickers 04/19/2018, 11:44 AM

## 2020-03-27 ENCOUNTER — Ambulatory Visit: Payer: Self-pay | Attending: Internal Medicine

## 2020-03-27 DIAGNOSIS — Z23 Encounter for immunization: Secondary | ICD-10-CM

## 2020-03-27 NOTE — Progress Notes (Signed)
   Covid-19 Vaccination Clinic  Name:  Jameria Bradway    MRN: [id number] DOB: February 18, 2001  03/27/2020  Ms. Lebeck was observed post Covid-19 immunization for 15 minutes without incident. She was provided with Vaccine Information Sheet and instruction to access the V-Safe system.   Ms. Noori was instructed to call 911 with any severe reactions post vaccine: Marland Kitchen Difficulty breathing  . Swelling of face and throat  . A fast heartbeat  . A bad rash all over body  . Dizziness and weakness   Immunizations Administered    Name Date Dose VIS Date Route   Pfizer COVID-19 Vaccine 03/27/2020  5:04 PM 0.3 mL 01/24/2019 Intramuscular   Manufacturer: [employer], [employer]   Lot: [id number]   NDC: [address]-1

## 2020-04-22 ENCOUNTER — Ambulatory Visit: Payer: Self-pay | Attending: Internal Medicine

## 2020-04-22 DIAGNOSIS — Z23 Encounter for immunization: Secondary | ICD-10-CM

## 2020-04-22 NOTE — Progress Notes (Signed)
   Covid-19 Vaccination Clinic  Name:  Carrie York    MRN: [id number] DOB: 06/14/2001  04/22/2020  [name] was observed post Covid-19 immunization for 15 minutes without incident. She was provided with Vaccine Information Sheet and instruction to access the V-Safe system.   [name] was instructed to call 911 with any severe reactions post vaccine: Marland Kitchen Difficulty breathing  . Swelling of face and throat  . A fast heartbeat  . A bad rash all over body  . Dizziness and weakness   Immunizations Administered    Name Date Dose VIS Date Route   Pfizer COVID-19 Vaccine 04/22/2020  4:20 PM 0.3 mL 01/24/2019 Intramuscular   Manufacturer: [employer], [employer]   Lot: [id number]   NDC: [address]-5

## 2020-11-30 NOTE — L&D Delivery Note (Signed)
OB/GYN Faculty Practice Delivery Note  Carrie York is a 20 y.o. G2P1001 s/p SVD at [redacted]w[redacted]d. She was admitted for onset of labor.   ROM: unsure time of ROM; clear fluid noted with delivery of baby GBS Status: neg Maximum Maternal Temperature: 99.5  Labor Progress: [name] was admitted in active labor, received an epidural, and progressed spontaneously to completely dilated. Membranes weren't witnessed to have ruptured, however with the delivery of the baby's body there was an appropriate amount of clear fluid. Terminal mec also noted. She was given a dose of TXA immed after delivery prophylactically as she had a 500cc PPH with her first preg.  Delivery Date/Time: June 14, 2021 at 1644 Delivery: Called to room and patient was complete and pushing. Head delivered LOA. No nuchal cord present. Shoulder and body delivered in usual fashion. Infant with spontaneous cry, placed on mother's abdomen, dried and stimulated. Cord clamped x 2 after 1-minute delay, and cut by [name]. Cord blood drawn. Placenta delivered spontaneously with gentle cord traction. Fundus firm with massage and Pitocin. Labia, perineum, vagina, and cervix inspected and found to be intact.   Placenta: spont, to L&D Complications: none Lacerations: none EBL: 150cc Analgesia: epidural  Postpartum Planning [x ] Depo ordered in case she wants it prior to d/c  Infant: boy  APGARs 8/9  3776g (8lb 5.2oz)  Arabella Merles, CNM  06/14/2021 5:04 PM

## 2020-12-09 LAB — OB RESULTS CONSOLE RPR: RPR: NONREACTIVE

## 2020-12-09 LAB — OB RESULTS CONSOLE ANTIBODY SCREEN: Antibody Screen: NEGATIVE

## 2020-12-09 LAB — OB RESULTS CONSOLE RUBELLA ANTIBODY, IGM: Rubella: IMMUNE

## 2020-12-09 LAB — OB RESULTS CONSOLE GC/CHLAMYDIA
Chlamydia: NEGATIVE
Gonorrhea: NEGATIVE

## 2020-12-09 LAB — OB RESULTS CONSOLE ABO/RH: RH Type: POSITIVE

## 2020-12-09 LAB — OB RESULTS CONSOLE HEPATITIS B SURFACE ANTIGEN: Hepatitis B Surface Ag: NEGATIVE

## 2020-12-09 LAB — OB RESULTS CONSOLE HIV ANTIBODY (ROUTINE TESTING): HIV: NONREACTIVE

## 2021-05-12 LAB — OB RESULTS CONSOLE GBS: GBS: NEGATIVE

## 2021-06-05 ENCOUNTER — Encounter: Payer: Self-pay | Admitting: *Deleted

## 2021-06-06 ENCOUNTER — Other Ambulatory Visit: Payer: Self-pay | Admitting: Family

## 2021-06-06 DIAGNOSIS — O480 Post-term pregnancy: Secondary | ICD-10-CM

## 2021-06-09 ENCOUNTER — Ambulatory Visit: Payer: 59 | Attending: Obstetrics and Gynecology

## 2021-06-09 ENCOUNTER — Encounter: Payer: Self-pay | Admitting: *Deleted

## 2021-06-09 ENCOUNTER — Ambulatory Visit: Payer: 59 | Admitting: *Deleted

## 2021-06-09 ENCOUNTER — Other Ambulatory Visit: Payer: Self-pay

## 2021-06-09 VITALS — BP 114/68 | HR 84

## 2021-06-09 DIAGNOSIS — Z3A40 40 weeks gestation of pregnancy: Secondary | ICD-10-CM

## 2021-06-09 DIAGNOSIS — O480 Post-term pregnancy: Secondary | ICD-10-CM | POA: Diagnosis not present

## 2021-06-10 ENCOUNTER — Telehealth (HOSPITAL_COMMUNITY): Payer: Self-pay | Admitting: *Deleted

## 2021-06-10 ENCOUNTER — Encounter (HOSPITAL_COMMUNITY): Payer: Self-pay | Admitting: *Deleted

## 2021-06-10 NOTE — Telephone Encounter (Signed)
Preadmission screen Interpreter number [phone]

## 2021-06-11 ENCOUNTER — Other Ambulatory Visit: Payer: Self-pay | Admitting: Advanced Practice Midwife

## 2021-06-13 ENCOUNTER — Other Ambulatory Visit (HOSPITAL_COMMUNITY)
Admission: RE | Admit: 2021-06-13 | Discharge: 2021-06-13 | Disposition: A | Discharge status: Home / Self Care | Payer: 59 | Source: Ambulatory Visit | Attending: Obstetrics and Gynecology | Admitting: Obstetrics and Gynecology

## 2021-06-13 DIAGNOSIS — Z01812 Encounter for preprocedural laboratory examination: Secondary | ICD-10-CM | POA: Insufficient documentation

## 2021-06-13 DIAGNOSIS — Z20822 Contact with and (suspected) exposure to covid-19: Secondary | ICD-10-CM | POA: Insufficient documentation

## 2021-06-13 LAB — SARS CORONAVIRUS 2 (TAT 6-24 HRS): SARS Coronavirus 2: NEGATIVE

## 2021-06-14 ENCOUNTER — Other Ambulatory Visit: Payer: Self-pay

## 2021-06-14 ENCOUNTER — Encounter (HOSPITAL_COMMUNITY): Payer: Self-pay | Admitting: Obstetrics & Gynecology

## 2021-06-14 ENCOUNTER — Inpatient Hospital Stay (HOSPITAL_COMMUNITY): Payer: 59 | Admitting: Anesthesiology

## 2021-06-14 ENCOUNTER — Inpatient Hospital Stay (HOSPITAL_COMMUNITY)
Admission: AD | Admit: 2021-06-14 | Discharge: 2021-06-16 | DRG: 807 | Disposition: A | Discharge status: Home / Self Care | Payer: 59 | Attending: Obstetrics & Gynecology | Admitting: Obstetrics & Gynecology

## 2021-06-14 DIAGNOSIS — O480 Post-term pregnancy: Secondary | ICD-10-CM | POA: Diagnosis present

## 2021-06-14 DIAGNOSIS — Z20822 Contact with and (suspected) exposure to covid-19: Secondary | ICD-10-CM | POA: Diagnosis present

## 2021-06-14 DIAGNOSIS — Z3A40 40 weeks gestation of pregnancy: Secondary | ICD-10-CM

## 2021-06-14 DIAGNOSIS — O26893 Other specified pregnancy related conditions, third trimester: Secondary | ICD-10-CM | POA: Diagnosis present

## 2021-06-14 DIAGNOSIS — Z8759 Personal history of other complications of pregnancy, childbirth and the puerperium: Secondary | ICD-10-CM

## 2021-06-14 LAB — CBC
HCT: 38.1 % (ref 36.0–46.0)
Hemoglobin: 11.9 g/dL — ABNORMAL LOW (ref 12.0–15.0)
MCH: 26.2 pg (ref 26.0–34.0)
MCHC: 31.2 g/dL (ref 30.0–36.0)
MCV: 83.9 fL (ref 80.0–100.0)
Platelets: 258 10*3/uL (ref 150–400)
RBC: 4.54 MIL/uL (ref 3.87–5.11)
RDW: 14.6 % (ref 11.5–15.5)
WBC: 16.2 10*3/uL — ABNORMAL HIGH (ref 4.0–10.5)
nRBC: 0 % (ref 0.0–0.2)

## 2021-06-14 LAB — TYPE AND SCREEN
ABO/RH(D): AB POS
Antibody Screen: NEGATIVE

## 2021-06-14 MED ORDER — SENNOSIDES-DOCUSATE SODIUM 8.6-50 MG PO TABS
2.0000 | ORAL_TABLET | ORAL | Status: DC
  Administered 2021-06-15: 2 via ORAL
  Filled 2021-06-14: qty 2

## 2021-06-14 MED ORDER — BENZOCAINE-MENTHOL 20-0.5 % EX AERO: 1.0000 "application " | INHALATION_SPRAY | CUTANEOUS | Status: DC | PRN

## 2021-06-14 MED ORDER — WITCH HAZEL-GLYCERIN EX PADS: 1.0000 "application " | MEDICATED_PAD | CUTANEOUS | Status: DC | PRN

## 2021-06-14 MED ORDER — MISOPROSTOL 25 MCG QUARTER TABLET
25.0000 ug | ORAL_TABLET | ORAL | Status: DC | PRN
  Filled 2021-06-14: qty 1

## 2021-06-14 MED ORDER — LACTATED RINGERS IV SOLN: 500.0000 mL | Freq: Once | INTRAVENOUS | Status: DC

## 2021-06-14 MED ORDER — DIPHENHYDRAMINE HCL 25 MG PO CAPS: 25.0000 mg | ORAL_CAPSULE | Freq: Four times a day (QID) | ORAL | Status: DC | PRN

## 2021-06-14 MED ORDER — LIDOCAINE HCL (PF) 1 % IJ SOLN: 30.0000 mL | INTRAMUSCULAR | Status: DC | PRN

## 2021-06-14 MED ORDER — PRENATAL MULTIVITAMIN CH
1.0000 | ORAL_TABLET | Freq: Every day | ORAL | Status: DC
  Administered 2021-06-15: 1 via ORAL
  Filled 2021-06-14: qty 1

## 2021-06-14 MED ORDER — EPHEDRINE 5 MG/ML INJ: 10.0000 mg | INTRAVENOUS | Status: DC | PRN

## 2021-06-14 MED ORDER — LACTATED RINGERS IV SOLN
500.0000 mL | Freq: Once | INTRAVENOUS | Status: AC
  Administered 2021-06-14: 500 mL via INTRAVENOUS

## 2021-06-14 MED ORDER — OXYTOCIN-SODIUM CHLORIDE 30-0.9 UT/500ML-% IV SOLN
2.5000 [IU]/h | INTRAVENOUS | Status: DC
  Administered 2021-06-14: 2.5 [IU]/h via INTRAVENOUS
  Filled 2021-06-14: qty 500

## 2021-06-14 MED ORDER — TRANEXAMIC ACID-NACL 1000-0.7 MG/100ML-% IV SOLN
INTRAVENOUS | Status: AC
  Administered 2021-06-14: 1000 mg
  Filled 2021-06-14: qty 100

## 2021-06-14 MED ORDER — ONDANSETRON HCL 4 MG/2ML IJ SOLN: 4.0000 mg | INTRAMUSCULAR | Status: DC | PRN

## 2021-06-14 MED ORDER — OXYCODONE-ACETAMINOPHEN 5-325 MG PO TABS: 1.0000 | ORAL_TABLET | ORAL | Status: DC | PRN

## 2021-06-14 MED ORDER — OXYTOCIN BOLUS FROM INFUSION
333.0000 mL | Freq: Once | INTRAVENOUS | Status: AC
  Administered 2021-06-14: 333 mL via INTRAVENOUS

## 2021-06-14 MED ORDER — IBUPROFEN 600 MG PO TABS
600.0000 mg | ORAL_TABLET | Freq: Four times a day (QID) | ORAL | Status: DC
  Administered 2021-06-14 – 2021-06-16 (×6): 600 mg via ORAL
  Filled 2021-06-14 (×6): qty 1

## 2021-06-14 MED ORDER — LACTATED RINGERS IV SOLN: 500.0000 mL | INTRAVENOUS | Status: DC | PRN

## 2021-06-14 MED ORDER — MEASLES, MUMPS & RUBELLA VAC IJ SOLR: 0.5000 mL | Freq: Once | INTRAMUSCULAR | Status: DC

## 2021-06-14 MED ORDER — MEDROXYPROGESTERONE ACETATE 150 MG/ML IM SUSP: 150.0000 mg | INTRAMUSCULAR | Status: DC | PRN

## 2021-06-14 MED ORDER — PHENYLEPHRINE 40 MCG/ML (10ML) SYRINGE FOR IV PUSH (FOR BLOOD PRESSURE SUPPORT): 80.0000 ug | PREFILLED_SYRINGE | INTRAVENOUS | Status: DC | PRN

## 2021-06-14 MED ORDER — TETANUS-DIPHTH-ACELL PERTUSSIS 5-2.5-18.5 LF-MCG/0.5 IM SUSY: 0.5000 mL | PREFILLED_SYRINGE | Freq: Once | INTRAMUSCULAR | Status: DC

## 2021-06-14 MED ORDER — DIBUCAINE (PERIANAL) 1 % EX OINT: 1.0000 "application " | TOPICAL_OINTMENT | CUTANEOUS | Status: DC | PRN

## 2021-06-14 MED ORDER — FENTANYL-BUPIVACAINE-NACL 0.5-0.125-0.9 MG/250ML-% EP SOLN
EPIDURAL | Status: AC
  Filled 2021-06-14: qty 250

## 2021-06-14 MED ORDER — ACETAMINOPHEN 325 MG PO TABS: 650.0000 mg | ORAL_TABLET | ORAL | Status: DC | PRN

## 2021-06-14 MED ORDER — FENTANYL-BUPIVACAINE-NACL 0.5-0.125-0.9 MG/250ML-% EP SOLN
12.0000 mL/h | EPIDURAL | Status: DC | PRN
  Administered 2021-06-14: 12 mL/h via EPIDURAL

## 2021-06-14 MED ORDER — ONDANSETRON HCL 4 MG PO TABS: 4.0000 mg | ORAL_TABLET | ORAL | Status: DC | PRN

## 2021-06-14 MED ORDER — SOD CITRATE-CITRIC ACID 500-334 MG/5ML PO SOLN: 30.0000 mL | ORAL | Status: DC | PRN

## 2021-06-14 MED ORDER — LACTATED RINGERS IV SOLN: INTRAVENOUS | Status: DC

## 2021-06-14 MED ORDER — ZOLPIDEM TARTRATE 5 MG PO TABS
5.0000 mg | ORAL_TABLET | Freq: Every evening | ORAL | Status: DC | PRN
Start: 2021-06-14 — End: 2021-06-16

## 2021-06-14 MED ORDER — LIDOCAINE HCL (PF) 1 % IJ SOLN
INTRAMUSCULAR | Status: DC | PRN
  Administered 2021-06-14: 5 mL via EPIDURAL
  Administered 2021-06-14: 2 mL via EPIDURAL
  Administered 2021-06-14: 3 mL via EPIDURAL

## 2021-06-14 MED ORDER — ONDANSETRON HCL 4 MG/2ML IJ SOLN: 4.0000 mg | Freq: Four times a day (QID) | INTRAMUSCULAR | Status: DC | PRN

## 2021-06-14 MED ORDER — FENTANYL CITRATE (PF) 100 MCG/2ML IJ SOLN: 100.0000 ug | INTRAMUSCULAR | Status: DC | PRN

## 2021-06-14 MED ORDER — OXYCODONE HCL 5 MG PO TABS: 5.0000 mg | ORAL_TABLET | ORAL | Status: DC | PRN

## 2021-06-14 MED ORDER — OXYCODONE-ACETAMINOPHEN 5-325 MG PO TABS: 2.0000 | ORAL_TABLET | ORAL | Status: DC | PRN

## 2021-06-14 MED ORDER — TERBUTALINE SULFATE 1 MG/ML IJ SOLN: 0.2500 mg | Freq: Once | INTRAMUSCULAR | Status: DC | PRN

## 2021-06-14 MED ORDER — COCONUT OIL OIL: 1.0000 "application " | TOPICAL_OIL | Status: DC | PRN

## 2021-06-14 MED ORDER — ACETAMINOPHEN 325 MG PO TABS
650.0000 mg | ORAL_TABLET | ORAL | Status: DC | PRN
  Administered 2021-06-15: 650 mg via ORAL
  Filled 2021-06-14: qty 2

## 2021-06-14 MED ORDER — SIMETHICONE 80 MG PO CHEW: 80.0000 mg | CHEWABLE_TABLET | ORAL | Status: DC | PRN

## 2021-06-14 MED ORDER — DIPHENHYDRAMINE HCL 50 MG/ML IJ SOLN: 12.5000 mg | INTRAMUSCULAR | Status: DC | PRN

## 2021-06-14 NOTE — Anesthesia Procedure Notes (Signed)
Epidural Patient location during procedure: OB Start time: 06/14/2021 1:33 PM End time: 06/14/2021 1:39 PM  Staffing Anesthesiologist: Cecile Hearing, MD Performed: anesthesiologist   Preanesthetic Checklist Completed: patient identified, IV checked, risks and benefits discussed, monitors and equipment checked, pre-op evaluation and timeout performed  Epidural Patient position: sitting Prep: DuraPrep Patient monitoring: blood pressure and continuous pulse ox Approach: midline Location: L3-L4 Injection technique: LOR air  Needle:  Needle type: Tuohy  Needle gauge: 17 G Needle length: 9 cm Needle insertion depth: 6 cm Catheter size: 19 Gauge Catheter at skin depth: 11 cm Test dose: negative and Other (1% Lidocaine)  Additional Notes Patient identified.  Risk benefits discussed including failed block, incomplete pain control, headache, nerve damage, paralysis, blood pressure changes, nausea, vomiting, reactions to medication both toxic or allergic, and postpartum back pain.  Patient expressed understanding and wished to proceed.  All questions were answered.  Sterile technique used throughout procedure and epidural site dressed with sterile barrier dressing. No paresthesia or other complications noted. The patient did not experience any signs of intravascular injection such as tinnitus or metallic taste in mouth nor signs of intrathecal spread such as rapid motor block. Please see nursing notes for vital signs. Reason for block:procedure for pain

## 2021-06-14 NOTE — H&P (Signed)
Carrie York is a 20 y.o. G2P1001 female at [redacted]w[redacted]d by 20wk u/s presenting with SOL.   Reports active fetal movement, contractions: regular, every 2-4 minutes, vaginal bleeding: scant staining, membranes: intact.  Initiated prenatal care at [facility] at 13 wks.   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, managed w rectal cycotec)  Past Medical History: Past Medical History:  Diagnosis Date   Anemia    PPH (postpartum hemorrhage)    UTI (urinary tract infection)     Past Surgical History: Past Surgical History:  Procedure Laterality Date   NO PAST SURGERIES      Obstetrical History: OB History     Gravida  2   Para  1   Term  1   Preterm      AB      Living  1      SAB      IAB      Ectopic      Multiple  0   Live Births  1           Social History: Social History   Socioeconomic History   Marital status: Unknown    Spouse name: Not on file   Number of children: Not on file   Years of education: Not on file   Highest education level: Not on file  Occupational History   Not on file  Tobacco Use   Smoking status: Never   Smokeless tobacco: Never  Vaping Use   Vaping Use: Never used  Substance and Sexual Activity   Alcohol use: No   Drug use: No   Sexual activity: Not Currently    Birth control/protection: None  Other Topics Concern   Not on file  Social History Narrative   Not on file   Social Determinants of Health   Financial Resource Strain: Not on file  Food Insecurity: Not on file  Transportation Needs: Not on file  Physical Activity: Not on file  Stress: Not on file  Social Connections: Not on file    Family History: Family History  Problem Relation Age of Onset   Diabetes Neg Hx    Heart disease Neg Hx    Hypertension Neg Hx     Allergies: No Known Allergies  Medications Prior to  Admission  Medication Sig Dispense Refill Last Dose   ibuprofen (ADVIL,MOTRIN) 600 MG tablet Take 1 tablet (600 mg total) by mouth every 6 (six) hours. (Patient not taking: Reported on 06/09/2021) 30 tablet 0    Prenatal Vit-Fe Fumarate-FA (PRENATAL MULTIVITAMIN) TABS tablet Take 1 tablet by mouth daily at 12 noon.       Review of Systems  Pertinent pos/neg as indicated in HPI  Blood pressure 121/78, pulse 88, temperature 98.2 F (36.8 C), temperature source Oral, resp. rate 16, height 5\' 2"  (1.575 m), weight 71.8 kg, last menstrual period 09/04/2020, SpO2 97 %, unknown if currently breastfeeding. General appearance: alert, cooperative, and no distress Lungs: clear to auscultation bilaterally Heart: regular rate and rhythm Abdomen: gravid, soft, non-tender, EFW by Leopold's approximately 7lbs Extremities: 1+ edema DTR's nl  Fetal monitoring: FHR: 140-150 bpm, variability: moderate,  Accelerations: Present,  decelerations:  Absent Uterine activity: reg q 2-4 mins Dilation: 6 Effacement (%): 80 Station: -1 Exam by:: [email or web address] RN Presentation: cephalic   Prenatal labs: ABO, Rh: --/--/PENDING (07/16 1258) Antibody: PENDING (07/16  1258) Rubella: Immune (01/10 0000) RPR: Nonreactive (01/10 0000)  HBsAg: Negative (01/10 0000)  HIV: Non-reactive (01/10 0000)  GBS: Negative/-- (06/13 0000)  3hr GTT: 74/92/89/89  Prenatal Transfer Tool  Maternal Diabetes: No Genetic Screening: Normal Maternal Ultrasounds/Referrals: Normal Fetal Ultrasounds or other Referrals:  None Maternal Substance Abuse:  No Significant Maternal Medications:  None Significant Maternal Lab Results: Group B Strep negative  Results for orders placed or performed during the hospital encounter of 06/14/21 (from the past 24 hour(s))  Type and screen   Collection Time: 06/14/21 12:58 PM  Result Value Ref Range   ABO/RH(D) PENDING    Antibody Screen PENDING    Sample Expiration       06/17/2021,2359 Performed at [facility], [address], [place], [place] [address]      Assessment:  [redacted]w[redacted]d SIUP  G2P1001  Active labor  Cat 1 FHR  GBS Negative/-- (06/13 0000)  Plan:  Admit to L&D  IV pain meds/epidural prn active labor  Expectant management  Anticipate vag del   Plans to breast and bottlefeed  Contraception: Depo  Circumcision: no  Arabella Merles CNM 06/14/2021, 1:18 PM

## 2021-06-14 NOTE — MAU Note (Signed)
Presents with ctxs 2-4 minutes apart that began this morning @ 0700, but increasing intensity since 1040.  Denies LOF or VB, has mucous discharge.  Endorses +FM.

## 2021-06-14 NOTE — Discharge Summary (Addendum)
Postpartum Discharge Summary     Patient Name: Carrie York DOB: Feb 17, 2001 MRN: [id number]  Date of admission: 06/14/2021 Delivery date:06/14/2021  Delivering provider: Serita Grammes D  Date of discharge: 06/16/2021  Admitting diagnosis: Post term pregnancy over 40 weeks [O48.0] Intrauterine pregnancy: [redacted]w[redacted]d     Secondary diagnosis:  Active Problems:   History of postpartum hemorrhage   Post term pregnancy over 40 weeks  Additional problems: none    Discharge diagnosis: Term Pregnancy Delivered                                              Post partum procedures: none Augmentation:  none Complications: None  Hospital course: Onset of Labor With Vaginal Delivery      20 y.o. yo G2P1001 at [redacted]w[redacted]d was admitted in Active Labor on 06/14/2021. Patient had an uncomplicated labor course as follows:  Membrane Rupture Time/Date:  ,  N/A Delivery Method:Vaginal, Spontaneous  Episiotomy: None  Lacerations:   None Patient had an uncomplicated postpartum course.  She is ambulating, tolerating a regular diet, passing flatus, and urinating well. Patient is discharged home in stable condition on 06/16/21.  Newborn Data: Birth date:06/14/2021  Birth time:4:44 PM  Gender:Female  Living status:Living  Apgars:8 ,9  Weight:3776 g (8lb 5.2oz)  Magnesium Sulfate received: No BMZ received: No Rhophylac:N/A MMR:N/A T-DaP:Given prenatally Flu: No Transfusion:No  Physical exam  Vitals:   06/15/21 0804 06/15/21 1344 06/15/21 2015 06/16/21 0604  BP: 111/75 108/68 105/66 100/71  Pulse: 85 87 79 88  Resp: $Remo'16 18 18 18  'XKitw$ Temp: 98.6 F (37 C) 98.4 F (36.9 C) 98 F (36.7 C) 98.1 F (36.7 C)  TempSrc: Oral Oral Oral Oral  SpO2: 97% 98% 98% 99%  Weight:      Height:       General: alert, cooperative, and no distress Lochia: appropriate Uterine Fundus: firm Incision: N/A DVT Evaluation: No evidence of DVT seen on physical exam. Negative Homan's sign. No cords or calf tenderness. Labs: Lab  Results  Component Value Date   WBC 16.2 (H) 06/14/2021   HGB 11.9 (L) 06/14/2021   HCT 38.1 06/14/2021   MCV 83.9 06/14/2021   PLT 258 06/14/2021   CMP Latest Ref Rng & Units 04/04/2013  Glucose 70 - 99 mg/dL 107(H)  BUN 6 - 23 mg/dL 17  Creatinine 0.47 - 1.00 mg/dL 0.53  Sodium 135 - 145 mEq/L 135  Potassium 3.5 - 5.1 mEq/L 3.9  Chloride 96 - 112 mEq/L 102  CO2 19 - 32 mEq/L 21  Calcium 8.4 - 10.5 mg/dL 9.0  Total Protein 6.0 - 8.3 g/dL 7.7  Total Bilirubin 0.3 - 1.2 mg/dL 1.7(H)  Alkaline Phos 50 - 162 U/L 81  AST 0 - 37 U/L 71(H)  ALT 0 - 35 U/L 50(H)   Flavia Shipper Score: Edinburgh Postnatal Depression Scale Screening Tool 06/15/2021  I have been able to laugh and see the funny side of things. 0  I have looked forward with enjoyment to things. 0  I have blamed myself unnecessarily when things went wrong. 0  I have been anxious or worried for no good reason. 0  I have felt scared or panicky for no good reason. 0  Things have been getting on top of me. 0  I have been so unhappy that I have had difficulty sleeping. 0  I have  felt sad or miserable. 0  I have been so unhappy that I have been crying. 0  The thought of harming myself has occurred to me. 0  Edinburgh Postnatal Depression Scale Total 0     After visit meds:  Allergies as of 06/16/2021   No Known Allergies      Medication List     TAKE these medications    acetaminophen 325 MG tablet Commonly known as: Tylenol Take 2 tablets (650 mg total) by mouth every 4 (four) hours as needed (for pain scale < 4).   benzocaine-Menthol 20-0.5 % Aero Commonly known as: DERMOPLAST Apply 1 application topically as needed for irritation (perineal discomfort).   coconut oil Oil Apply 1 application topically as needed.   ibuprofen 600 MG tablet Commonly known as: ADVIL Take 1 tablet (600 mg total) by mouth every 6 (six) hours.   prenatal multivitamin Tabs tablet Take 1 tablet by mouth daily at 12 noon.          Discharge home in stable condition Infant Feeding: Bottle and Breast Infant Disposition:home with mother Discharge instruction: per After Visit Summary and Postpartum booklet. Activity: Advance as tolerated. Pelvic rest for 6 weeks.  Diet: routine diet Future Appointments:No future appointments. Follow up Visit:  Follow-up Information     Department, [facility]. Schedule an appointment as soon as possible for a visit in 4 week(s).   Why: For your postpartum appointment. Contact information: [place] [address] [place], MSN, CNM Certified Nurse Midwife, [facility] for [employer], [employer] 06/16/21 8:26 AM

## 2021-06-14 NOTE — Lactation Note (Signed)
This note was copied from a baby's chart. Lactation Consultation Note  Patient Name: Carrie York [name] Date: 06/14/2021 Reason for consult: L&D Initial assessment;Mother's request;Term Age: < 1 hr  Infant latched for about 4 mins with signs of milk transfer. Infant high palate and thick labial attachment. Infant then pops on and off breast even in laid back position.   LC to continue support once on the floor.   Maternal Data Has patient been taught Hand Expression?: Yes Does the patient have breastfeeding experience prior to this delivery?: Yes How long did the patient breastfeed?: 3 months them pumped and bottle fed  Feeding Mother's Current Feeding Choice: Breast Milk  LATCH Score Latch: Repeated attempts needed to sustain latch, nipple held in mouth throughout feeding, stimulation needed to elicit sucking reflex.  Audible Swallowing: A few with stimulation  Type of Nipple: Everted at rest and after stimulation  Comfort (Breast/Nipple): Soft / non-tender  Hold (Positioning): Assistance needed to correctly position infant at breast and maintain latch.  LATCH Score: 7   Lactation Tools Discussed/Used    Interventions Interventions: Breast feeding basics reviewed;Breast compression;Assisted with latch;Adjust position;Skin to skin;Support pillows;Breast massage;Position options;Hand express;Expressed milk;Education  Discharge    Consult Status Consult Status: Follow-up Date: 06/15/21 Follow-up type: In-patient    Carrie York  Nicholson-Springer 06/14/2021, 5:52 PM

## 2021-06-14 NOTE — Anesthesia Preprocedure Evaluation (Signed)
Anesthesia Evaluation  Patient identified by MRN, date of birth, ID band Patient awake    Reviewed: Allergy & Precautions, NPO status , Patient's Chart, lab work & pertinent test results  Airway Mallampati: II  TM Distance: >3 FB Neck ROM: Full    Dental  (+) Teeth Intact, Dental Advisory Given   Pulmonary neg pulmonary ROS,    Pulmonary exam normal breath sounds clear to auscultation       Cardiovascular negative cardio ROS Normal cardiovascular exam Rhythm:Regular Rate:Normal     Neuro/Psych negative neurological ROS     GI/Hepatic negative GI ROS, Neg liver ROS,   Endo/Other  negative endocrine ROS  Renal/GU negative Renal ROS     Musculoskeletal negative musculoskeletal ROS (+)   Abdominal   Peds  Hematology  (+) Blood dyscrasia, anemia , Plt 258k   Anesthesia Other Findings Day of surgery medications reviewed with the patient.  Reproductive/Obstetrics (+) Pregnancy                             Anesthesia Physical Anesthesia Plan  ASA: 2  Anesthesia Plan: Epidural   Post-op Pain Management:    Induction:   PONV Risk Score and Plan: 2 and Treatment may vary due to age or medical condition  Airway Management Planned: Natural Airway  Additional Equipment:   Intra-op Plan:   Post-operative Plan:   Informed Consent: I have reviewed the patients History and Physical, chart, labs and discussed the procedure including the risks, benefits and alternatives for the proposed anesthesia with the patient or authorized representative who has indicated his/her understanding and acceptance.     Dental advisory given  Plan Discussed with:   Anesthesia Plan Comments: (Patient identified. Risks/Benefits/Options discussed with patient including but not limited to bleeding, infection, nerve damage, paralysis, failed block, incomplete pain control, headache, blood pressure changes, nausea,  vomiting, reactions to medication both or allergic, itching and postpartum back pain. Confirmed with bedside nurse the patient's most recent platelet count. Confirmed with patient that they are not currently taking any anticoagulation, have any bleeding history or any family history of bleeding disorders. Patient expressed understanding and wished to proceed. All questions were answered. )        Anesthesia Quick Evaluation

## 2021-06-15 ENCOUNTER — Inpatient Hospital Stay (HOSPITAL_COMMUNITY)
Admission: AD | Admit: 2021-06-15 | Payer: Medicaid Other | Source: Home / Self Care | Admitting: Obstetrics & Gynecology

## 2021-06-15 ENCOUNTER — Inpatient Hospital Stay (HOSPITAL_COMMUNITY): Payer: Medicaid Other

## 2021-06-15 LAB — RPR: RPR Ser Ql: NONREACTIVE

## 2021-06-15 NOTE — Lactation Note (Signed)
This note was copied from a baby's chart. Lactation Consultation Note  Patient Name: [name] Today's Date: 06/15/2021 Reason for consult: Initial assessment;Term Age:22 hours  LC in to room for initial visit. Infant is latched to left breast upon arrival. Noted suboptimal alignment and position, shallow and chompy latch. Assisted with hand expression and fingerfed infant. Observed curling of upper lip, lack of tongue extension, weak suck and biting. Several attempts to improve latch unsuccessful.  Mother reports when infant is transitions to the tip of the nipple.  LC shared observations with Carrie Cruise, RN. Set up DEBP for stimulation and supplementation, talked about frequency/storage/care of parts. Offered information about alternatives in case supplementation is needed. Encouraged parents to talk to pediatrician about observations during feeding session.    Maternal Data Has patient been taught Hand Expression?: Yes Does the patient have breastfeeding experience prior to this delivery?: Yes How long did the patient breastfeed?: 3-4 months first child  Feeding Mother's Current Feeding Choice: Breast Milk  LATCH Score Latch: Repeated attempts needed to sustain latch, nipple held in mouth throughout feeding, stimulation needed to elicit sucking reflex.  Audible Swallowing: None  Type of Nipple: Everted at rest and after stimulation  Comfort (Breast/Nipple): Soft / non-tender  Hold (Positioning): Assistance needed to correctly position infant at breast and maintain latch.  LATCH Score: 6   Lactation Tools Discussed/Used Tools: Pump;Flanges Flange Size: 27 Breast pump type: Double-Electric Breast Pump;Manual Pump Education: Setup, frequency, and cleaning;Milk Storage Reason for Pumping: stimulation and supplementation Pumping frequency: every 3 hours  Interventions Interventions: Breast feeding basics reviewed;Assisted with latch;Skin to skin;Breast massage;Hand  express;Adjust position;Position options;Support pillows;Expressed milk;DEBP;Hand pump;Education  Discharge Pump: DEBP;Personal;Manual WIC Program: Yes  Consult Status Consult Status: Follow-up Date: 06/16/21 Follow-up type: In-patient    Carrie York A Higuera Ancidey 06/15/2021, 11:38 AM

## 2021-06-15 NOTE — Progress Notes (Signed)
Post Partum Day #1 Subjective: no complaints, up ad lib, and tolerating PO; breast and bottlefeeding; Depo for contraception  Objective: Blood pressure 111/75, pulse 85, temperature 98.6 F (37 C), temperature source Oral, resp. rate 16, height 5\' 2"  (1.575 m), weight 71.8 kg, last menstrual period 09/04/2020, SpO2 97 %, unknown if currently breastfeeding.  Physical Exam:  General: alert, cooperative, and no distress Lochia: appropriate Uterine Fundus: firm DVT Evaluation: No evidence of DVT seen on physical exam.  Recent Labs    06/14/21 1254  HGB 11.9*  HCT 38.1    Assessment/Plan: Plan for discharge tomorrow Depo ordered for prior to d/c   LOS: 1 day   06/16/21 06/15/2021, 9:39 AM

## 2021-06-15 NOTE — Anesthesia Postprocedure Evaluation (Signed)
Anesthesia Post Note  Patient: Carrie York  Procedure(s) Performed: AN AD HOC LABOR EPIDURAL     Patient location during evaluation: Mother Baby Anesthesia Type: Epidural Level of consciousness: awake and alert Pain management: pain level controlled Vital Signs Assessment: post-procedure vital signs reviewed and stable Respiratory status: spontaneous breathing, nonlabored ventilation and respiratory function stable Cardiovascular status: stable Postop Assessment: no headache, no backache, epidural receding, no apparent nausea or vomiting, patient able to bend at knees, adequate PO intake and able to ambulate Anesthetic complications: no   No notable events documented.  Last Vitals:  Vitals:   06/14/21 2319 06/15/21 0345  BP: 113/67 104/65  Pulse: 100 86  Resp: 18 18  Temp: 36.9 C 36.7 C  SpO2: 98% 98%    Last Pain:  Vitals:   06/15/21 0725  TempSrc:   PainSc: 3    Pain Goal:                   Laban Emperor

## 2021-06-16 MED ORDER — BENZOCAINE-MENTHOL 20-0.5 % EX AERO: 1.0000 "application " | INHALATION_SPRAY | CUTANEOUS | Status: AC | PRN

## 2021-06-16 MED ORDER — COCONUT OIL OIL: 1.0000 "application " | TOPICAL_OIL | 0 refills | Status: AC | PRN

## 2021-06-16 MED ORDER — ACETAMINOPHEN 325 MG PO TABS: 650.0000 mg | ORAL_TABLET | ORAL | Status: AC | PRN

## 2021-06-16 MED ORDER — IBUPROFEN 600 MG PO TABS: 600.0000 mg | ORAL_TABLET | Freq: Four times a day (QID) | ORAL | 0 refills | Status: AC

## 2021-06-26 ENCOUNTER — Telehealth (HOSPITAL_COMMUNITY): Payer: Self-pay | Admitting: *Deleted

## 2021-06-26 NOTE — Telephone Encounter (Signed)
Interpreter left message to return nurse call for hospital discharge follow-up call.  Duffy Rhody, RN 06/26/2021 at 11:25am

## 2023-03-13 IMAGING — US US MFM FETAL BPP W/O NON-STRESS
1 series · 12 of 25 positions shown · non-contrast
Comparison: none

[Series 1: us mfm fetal bpp w/o non-stress · 25 acquisitions, 12 frames shown]
[im 2/25]
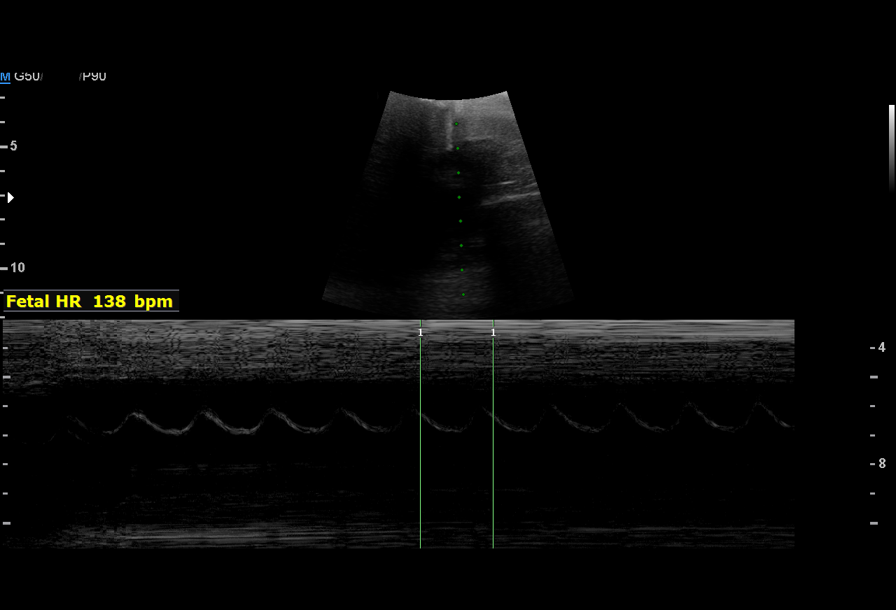
[im 4/25]
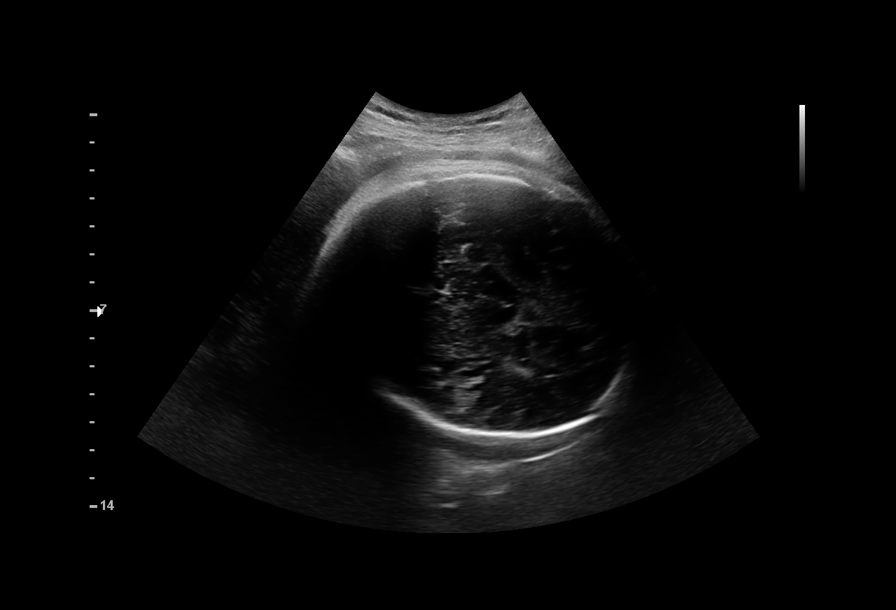
[im 6/25]
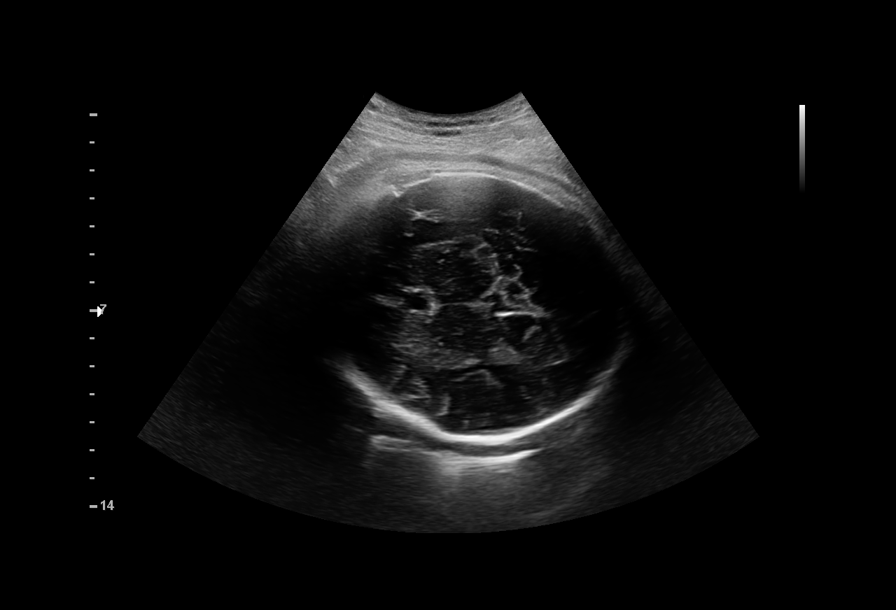
[im 8/25]
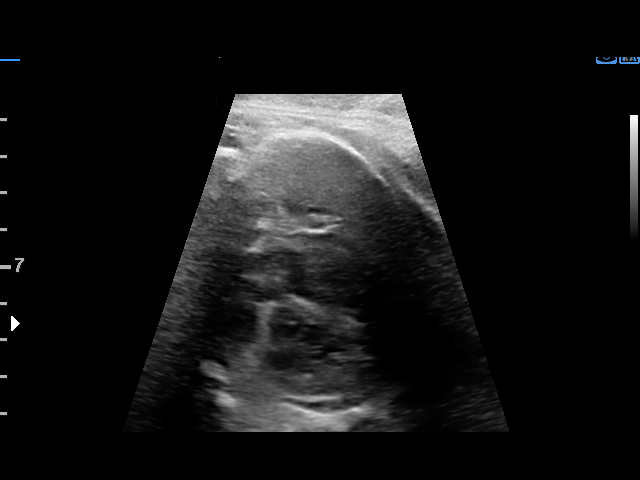
[im 10/25]
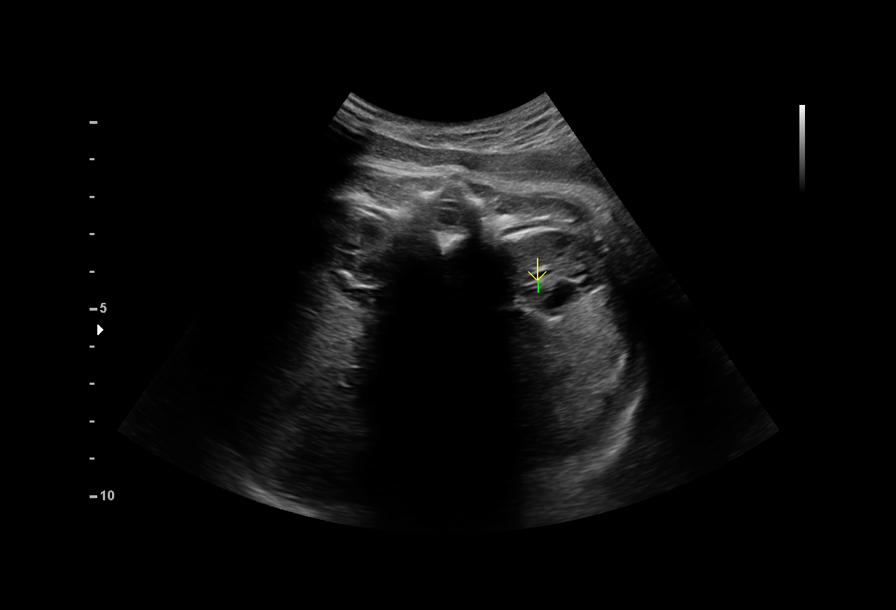
[im 12/25]
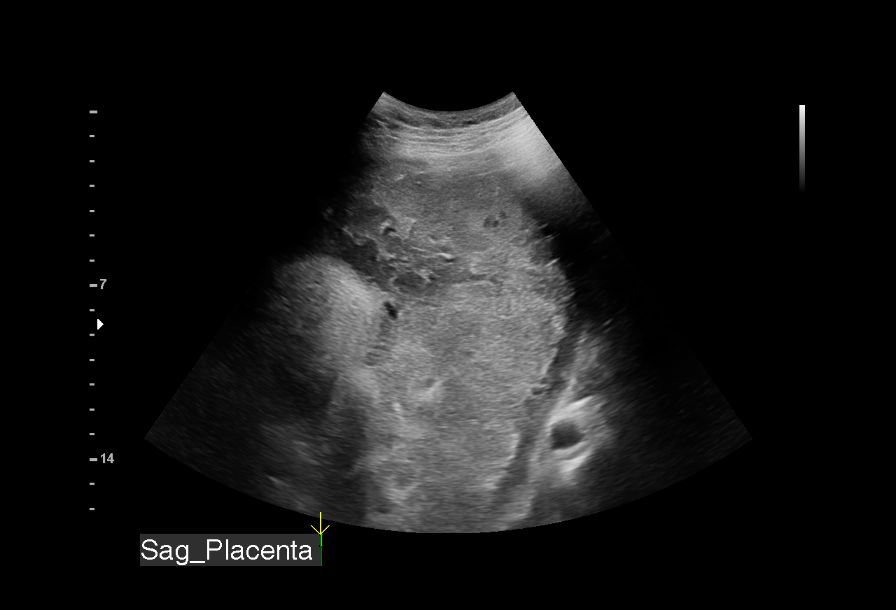
[im 14/25]
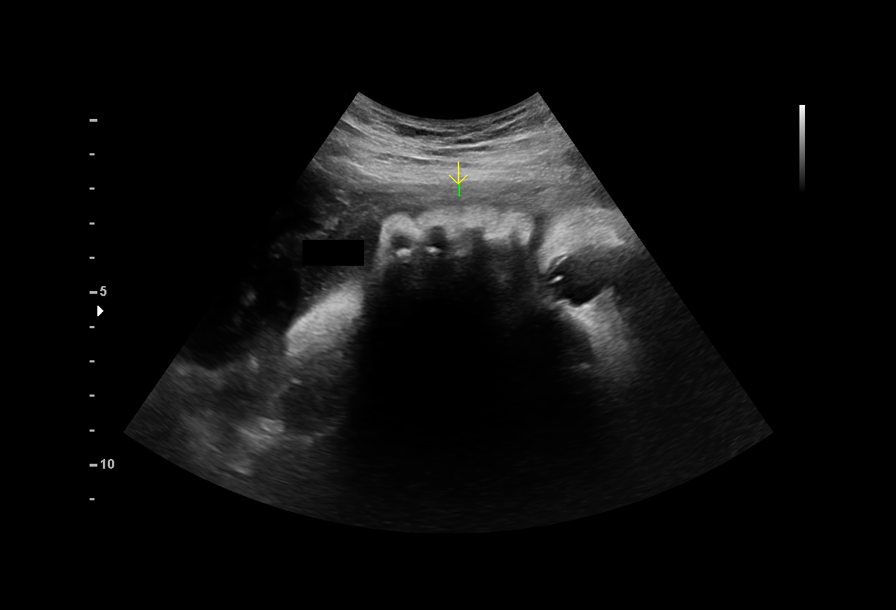
[im 16/25]
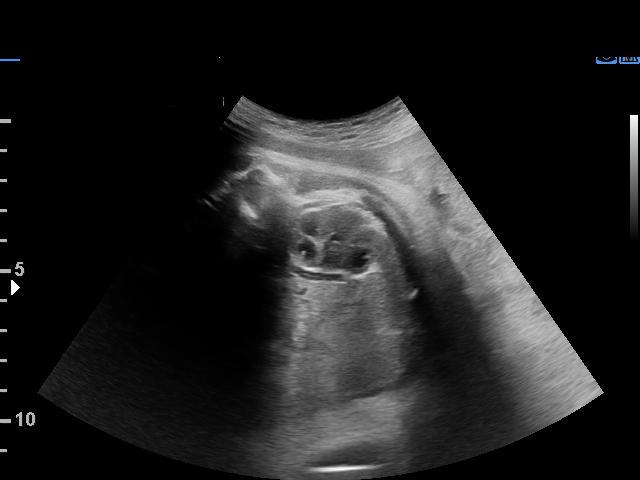
[im 18/25]
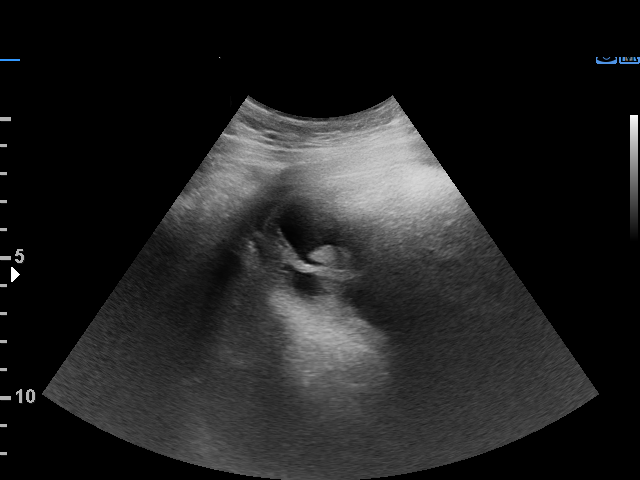
[im 20/25]
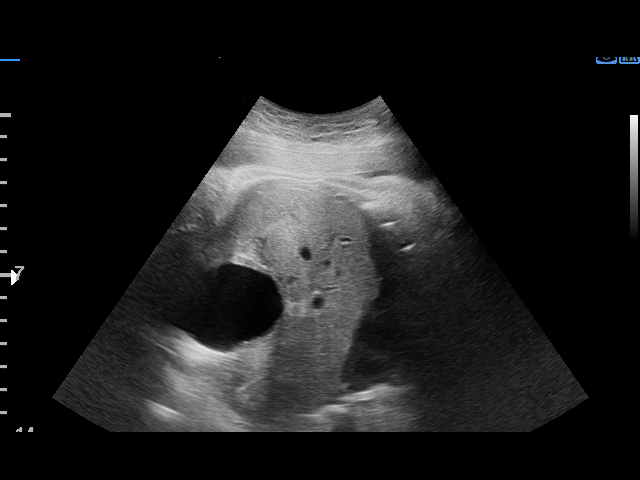
[im 22/25]
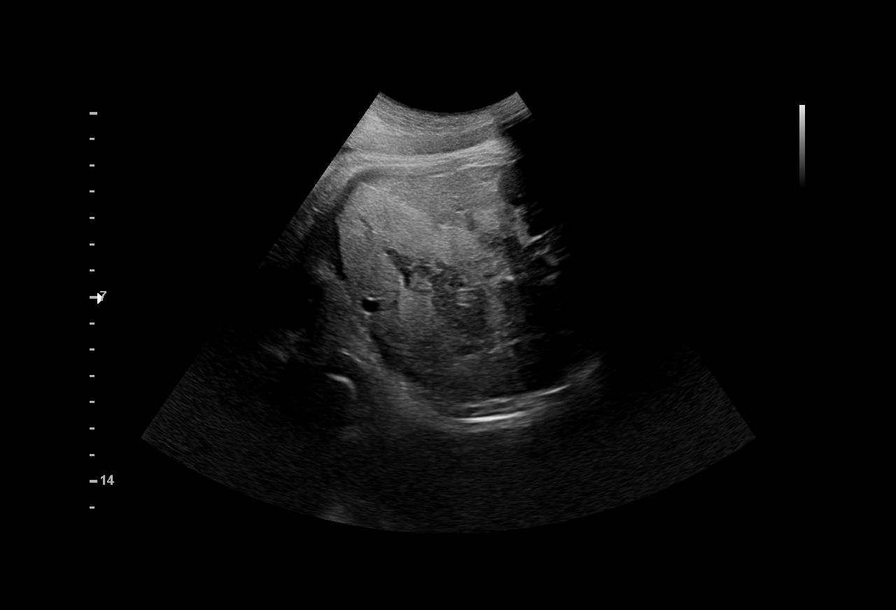
[im 24/25]
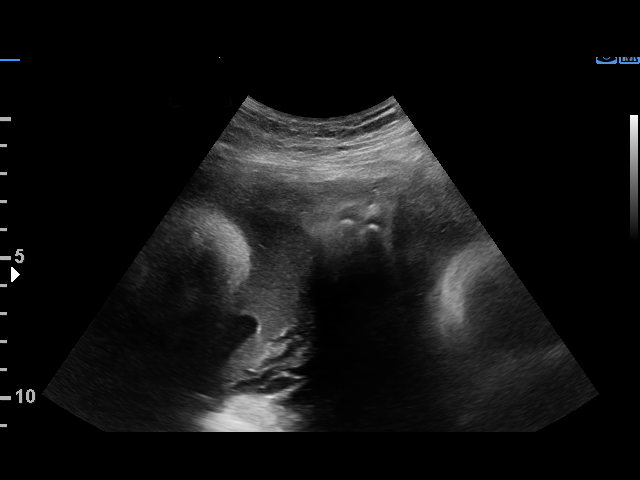

[12 of 25 positions shown; findings below may reference images not displayed]

Indications

 40 weeks gestation of pregnancy
 Postdate pregnancy (40-42 weeks)
Fetal Evaluation

 Num Of Fetuses:         1
 Fetal Heart Rate(bpm):  138
 Cardiac Activity:       Observed
 Presentation:           Cephalic
 Placenta:               Left lateral
 P. Cord Insertion:      Not well visualized

 Amniotic Fluid
 AFI FV:      Within normal limits

 AFI Sum(cm)     %Tile       Largest Pocket(cm)
 11.7            46

 RUQ(cm)       RLQ(cm)       LUQ(cm)        LLQ(cm)

Biophysical Evaluation

 Amniotic F.V:   Within normal limits       F. Tone:        Observed
 F. Movement:    Observed                   Score:          [DATE]
 F. Breathing:   Observed
OB History

 Gravidity:    2         Term:   1
 Living:       1
Gestational Age

 LMP:           39w 5d        Date:  09/04/20                 EDD:   06/11/21
 Clinical EDD:  40w 1d                                        EDD:   06/08/21
 Best:          40w 1d     Det. By:  Clinical EDD             EDD:   06/08/21
Anatomy

 Ventricles:            Appears normal         Kidneys:                Appear normal
 Stomach:               Appears normal, left   Bladder:                Appears normal
                        sided
Cervix Uterus Adnexa

 Cervix
 Not visualized (advanced GA >11wks)

 Right Ovary
 Not visualized.

 Left Ovary
 Not visualized.
Impression

 Amniotic fluid is normal and good fetal activity seen.Antenatal
 testing is reassuring. BPP [DATE].  Cephalic presentation.

 Blood pressure today at her office is 114/68 mmHg.  We
 reassured the patient of the findings.
 Patient has a prenatal visit appointment after ultrasound.

                 Laureta, Po Leung
